# Patient Record
Sex: Female | Born: 1950 | Race: White | Hispanic: No | Marital: Married | Smoking: Never smoker
Health system: Southern US, Community
[De-identification: ages and names within clinical notes are randomized; demographics above are authoritative.]

## PROBLEM LIST (undated history)

## (undated) DIAGNOSIS — T8859XA Other complications of anesthesia, initial encounter: Secondary | ICD-10-CM

## (undated) DIAGNOSIS — C801 Malignant (primary) neoplasm, unspecified: Secondary | ICD-10-CM

## (undated) DIAGNOSIS — I10 Essential (primary) hypertension: Secondary | ICD-10-CM

## (undated) DIAGNOSIS — T4145XA Adverse effect of unspecified anesthetic, initial encounter: Secondary | ICD-10-CM

## (undated) HISTORY — DX: Essential (primary) hypertension: I10

## (undated) HISTORY — PX: BUNIONECTOMY: SHX129

## (undated) HISTORY — DX: Malignant (primary) neoplasm, unspecified: C80.1

---

## 1998-06-15 HISTORY — PX: GANGLION CYST EXCISION: SHX1691

## 2006-02-02 ENCOUNTER — Emergency Department (HOSPITAL_COMMUNITY): Admission: EM | Admit: 2006-02-02 | Discharge: 2006-02-02 | Payer: Self-pay | Admitting: Emergency Medicine

## 2006-12-30 ENCOUNTER — Other Ambulatory Visit: Admission: RE | Admit: 2006-12-30 | Discharge: 2006-12-30 | Payer: Self-pay | Admitting: Internal Medicine

## 2008-01-12 ENCOUNTER — Encounter: Admission: RE | Admit: 2008-01-12 | Discharge: 2008-01-12 | Payer: Self-pay | Admitting: Internal Medicine

## 2008-02-10 ENCOUNTER — Other Ambulatory Visit: Admission: RE | Admit: 2008-02-10 | Discharge: 2008-02-10 | Payer: Self-pay | Admitting: Internal Medicine

## 2009-02-25 ENCOUNTER — Other Ambulatory Visit: Admission: RE | Admit: 2009-02-25 | Discharge: 2009-02-25 | Payer: Self-pay | Admitting: Neurology

## 2012-08-02 ENCOUNTER — Other Ambulatory Visit (HOSPITAL_COMMUNITY)
Admission: RE | Admit: 2012-08-02 | Discharge: 2012-08-02 | Disposition: A | Discharge status: Home / Self Care | Payer: BC Managed Care – PPO | Source: Ambulatory Visit | Attending: Internal Medicine | Admitting: Internal Medicine

## 2012-08-02 DIAGNOSIS — Z01419 Encounter for gynecological examination (general) (routine) without abnormal findings: Secondary | ICD-10-CM | POA: Insufficient documentation

## 2012-08-08 ENCOUNTER — Other Ambulatory Visit: Payer: Self-pay | Admitting: Internal Medicine

## 2012-09-05 ENCOUNTER — Ambulatory Visit
Admission: RE | Admit: 2012-09-05 | Discharge: 2012-09-05 | Disposition: A | Discharge status: Home / Self Care | Payer: BC Managed Care – PPO | Source: Ambulatory Visit | Attending: Internal Medicine | Admitting: Internal Medicine

## 2012-09-05 DIAGNOSIS — Z1231 Encounter for screening mammogram for malignant neoplasm of breast: Secondary | ICD-10-CM

## 2012-11-01 ENCOUNTER — Encounter (INDEPENDENT_AMBULATORY_CARE_PROVIDER_SITE_OTHER): Payer: Self-pay | Admitting: Surgery

## 2012-11-04 ENCOUNTER — Encounter (INDEPENDENT_AMBULATORY_CARE_PROVIDER_SITE_OTHER): Payer: Self-pay

## 2012-11-04 ENCOUNTER — Encounter (INDEPENDENT_AMBULATORY_CARE_PROVIDER_SITE_OTHER): Payer: Self-pay | Admitting: Surgery

## 2012-11-04 ENCOUNTER — Ambulatory Visit (INDEPENDENT_AMBULATORY_CARE_PROVIDER_SITE_OTHER): Payer: BC Managed Care – PPO | Admitting: Surgery

## 2012-11-04 VITALS — BP 96/70 | HR 72 | Temp 99.3°F | Resp 14 | Ht 63.0 in | Wt 118.2 lb

## 2012-11-04 DIAGNOSIS — D126 Benign neoplasm of colon, unspecified: Secondary | ICD-10-CM

## 2012-11-04 NOTE — Progress Notes (Signed)
Patient ID: Judith Green, female   DOB: [birth year], 62 y.o.   MRN: [id number]  Chief Complaint  Patient presents with  . Other    colon polyp    HPI Judith Green is a 62 y.o. female.   HPI This is a very pleasant female referred by Dr. Dulce Sellar after screening colonoscopy demonstrated a large tubular adenoma in the hepatic flexure which was too large to be removed endoscopically. She is asymptomatic. She is fairly healthy and has no complaints. She has no obstructive symptoms. She moved her bowels well. Past Medical History  Diagnosis Date  . Hypertension     Past Surgical History  Procedure Laterality Date  . Bunionectomy      bil  . Ganglion cyst excision  2000    lt foot    Family History  Problem Relation Age of Onset  . Cancer Maternal Grandmother     ovarian    Social History History  Substance Use Topics  . Smoking status: Former Games developer  . Smokeless tobacco: Not on file  . Alcohol Use: No    Allergies  Allergen Reactions  . Codeine   . Keflex (Cephalexin) Nausea And Vomiting  . Losartan   . Penicillins Rash    Current Outpatient Prescriptions  Medication Sig Dispense Refill  . amLODipine (NORVASC) 10 MG tablet Take 10 mg by mouth daily.      Marland Kitchen aspirin 81 MG tablet Take 81 mg by mouth daily.      . cholecalciferol (VITAMIN D) 1000 UNITS tablet Take 1,000 Units by mouth daily.      Marland Kitchen triamterene-hydrochlorothiazide (MAXZIDE-25) 37.5-25 MG per tablet Take 1 tablet by mouth daily.       No current facility-administered medications for this visit.    Review of Systems Review of Systems  Constitutional: Negative for fever, chills and unexpected weight change.  HENT: Negative for hearing loss, congestion, sore throat, trouble swallowing and voice change.   Eyes: Negative for visual disturbance.  Respiratory: Negative for cough and wheezing.   Cardiovascular: Negative for chest pain, palpitations and leg swelling.  Gastrointestinal: Negative for nausea,  vomiting, abdominal pain, diarrhea, constipation, blood in stool, abdominal distention and anal bleeding.  Genitourinary: Negative for hematuria, vaginal bleeding and difficulty urinating.  Musculoskeletal: Negative for arthralgias.  Skin: Negative for rash and wound.  Neurological: Negative for seizures, syncope and headaches.  Hematological: Negative for adenopathy. Does not bruise/bleed easily.  Psychiatric/Behavioral: Negative for confusion.    Blood pressure 96/70, pulse 72, temperature 99.3 F (37.4 C), temperature source Temporal, resp. rate 14, height 5\' 3"  (1.6 m), weight 118 lb 3.2 oz (53.615 kg).  Physical Exam Physical Exam  Constitutional: She is oriented to person, place, and time. She appears well-developed and well-nourished. No distress.  HENT:  Head: Normocephalic and atraumatic.  Right Ear: External ear normal.  Left Ear: External ear normal.  Nose: Nose normal.  Mouth/Throat: Oropharynx is clear and moist.  Eyes: Conjunctivae are normal. Pupils are equal, round, and reactive to light. Right eye exhibits no discharge. Left eye exhibits no discharge. No scleral icterus.  Neck: Normal range of motion. Neck supple. No tracheal deviation present. No thyromegaly present.  Cardiovascular: Normal rate, regular rhythm, normal heart sounds and intact distal pulses.   No murmur heard. Pulmonary/Chest: Effort normal and breath sounds normal. No respiratory distress. She has no wheezes. She has no rales.  Abdominal: Soft. Bowel sounds are normal. She exhibits no distension and no mass. There is no tenderness. There  is no rebound.  Musculoskeletal: Normal range of motion. She exhibits no edema and no tenderness.  Lymphadenopathy:    She has no cervical adenopathy.  Neurological: She is alert and oriented to person, place, and time.  Skin: Skin is warm and dry. No rash noted. She is not diaphoretic. No erythema.  Psychiatric: Her behavior is normal. Judgment normal.    Data  Reviewed I have the notes from Dr. Dulce Sellar.  I have picture from the endoscopy including the mass in the hepatic flexure. This area was tattooed. The pathology confirmed a tubular adenoma without evidence of malignancy  Assessment    Tubular adenoma of the colon     Plan    As this area is too large to remove with the colonoscope, laparoscopic-assisted partial colectomy is recommended. I discussed the reasonings with this with the patient in detail. I gave her literature regarding surgery. I discussed the risks of surgery which includes but is not limited to bleeding, infection, injury to surrounding structures, need for further surgery, anastomotic leak, the potential for finding malignancy, etc. She understands and wishes to proceed. Surgery will be scheduled. Preoperative bowel preparation will be ordered. Likelihood of success is good        Amerigo Mcglory A 11/04/2012, 11:02 AM

## 2012-11-08 ENCOUNTER — Encounter (HOSPITAL_COMMUNITY): Payer: Self-pay | Admitting: Pharmacy Technician

## 2012-11-09 ENCOUNTER — Encounter (HOSPITAL_COMMUNITY): Payer: Self-pay

## 2012-11-09 ENCOUNTER — Telehealth (INDEPENDENT_AMBULATORY_CARE_PROVIDER_SITE_OTHER): Payer: Self-pay | Admitting: General Surgery

## 2012-11-09 ENCOUNTER — Encounter (HOSPITAL_COMMUNITY)
Admission: RE | Admit: 2012-11-09 | Discharge: 2012-11-09 | Disposition: A | Discharge status: Home / Self Care | Payer: BC Managed Care – PPO | Source: Ambulatory Visit | Attending: Surgery | Admitting: Surgery

## 2012-11-09 ENCOUNTER — Ambulatory Visit (HOSPITAL_COMMUNITY)
Admission: RE | Admit: 2012-11-09 | Discharge: 2012-11-09 | Disposition: A | Discharge status: Home / Self Care | Payer: BC Managed Care – PPO | Source: Ambulatory Visit | Attending: Surgery | Admitting: Surgery

## 2012-11-09 DIAGNOSIS — Z01818 Encounter for other preprocedural examination: Secondary | ICD-10-CM | POA: Insufficient documentation

## 2012-11-09 DIAGNOSIS — Z01810 Encounter for preprocedural cardiovascular examination: Secondary | ICD-10-CM | POA: Insufficient documentation

## 2012-11-09 DIAGNOSIS — I10 Essential (primary) hypertension: Secondary | ICD-10-CM | POA: Insufficient documentation

## 2012-11-09 HISTORY — DX: Adverse effect of unspecified anesthetic, initial encounter: T41.45XA

## 2012-11-09 HISTORY — DX: Other complications of anesthesia, initial encounter: T88.59XA

## 2012-11-09 LAB — CBC
HCT: 41.9 % (ref 36.0–46.0)
Hemoglobin: 14.1 g/dL (ref 12.0–15.0)
MCHC: 33.7 g/dL (ref 30.0–36.0)
MCV: 83.3 fL (ref 78.0–100.0)
RDW: 13.5 % (ref 11.5–15.5)

## 2012-11-09 LAB — SURGICAL PCR SCREEN
MRSA, PCR: NEGATIVE
Staphylococcus aureus: POSITIVE — AB

## 2012-11-09 LAB — BASIC METABOLIC PANEL
BUN: 21 mg/dL (ref 6–23)
Chloride: 99 mEq/L (ref 96–112)
Creatinine, Ser: 0.91 mg/dL (ref 0.50–1.10)
GFR calc Af Amer: 77 mL/min — ABNORMAL LOW (ref >90)
GFR calc non Af Amer: 67 mL/min — ABNORMAL LOW (ref >90)
Glucose, Bld: 118 mg/dL — ABNORMAL HIGH (ref 70–99)
Potassium: 3.1 mEq/L — ABNORMAL LOW (ref 3.5–5.1)

## 2012-11-09 NOTE — Telephone Encounter (Signed)
i'm fine with her potassium

## 2012-11-09 NOTE — Patient Instructions (Signed)
Judith Green  11/09/2012   Your procedure is scheduled on:  11/14/12    Report to [facility] at     1000 AM.  Call this number if you have problems the morning of surgery: [phone]   Remember:   Do not eat food or drink liquids after midnight.   Take these medicines the morning of surgery with A SIP OF WATER:    Do not wear jewelry, make-up or nail polish.  Do not wear lotions, powders, or perfumes.   Do not shave 48 hours prior to surgery.   Do not bring valuables to the hospital.  Contacts, dentures or bridgework may not be worn into surgery.  Leave suitcase in the car. After surgery it may be brought to your room.  For patients admitted to the hospital, checkout time is 11:00 AM the day of  discharge.    SEE CHG INSTRUCTION SHEET    Please read over the following fact sheets that you were given: MRSA Information, coughing and deep breathing exercises, leg exercises, Blood Transfusion Fact sheet                Failure to comply with these instructions may result in cancellation of your surgery.                Patient Signature ____________________________              Nurse Signature _____________________________

## 2012-11-09 NOTE — Telephone Encounter (Signed)
Carol with [facility] called to make [place] aware patient's preoperative potassium is at 3.1. She wanted to make sure Dr Magnus Ivan was aware. She is scheduled for a colectomy on 11/14/2012.

## 2012-11-09 NOTE — Progress Notes (Signed)
Sent via EPIC results of BMP done 11/09/12 to DR Barrie Dunker.

## 2012-11-09 NOTE — Progress Notes (Signed)
Spoke with Lesly Rubenstein at office of Dr Barrie Dunker regarding potassium results.She will let Dr Barrie Dunker be aware.

## 2012-11-09 NOTE — Telephone Encounter (Signed)
Tried to make Okey Regal at [facility] aware. They are gone for the day.

## 2012-11-10 ENCOUNTER — Telehealth (INDEPENDENT_AMBULATORY_CARE_PROVIDER_SITE_OTHER): Payer: Self-pay | Admitting: General Surgery

## 2012-11-10 NOTE — Progress Notes (Signed)
Received call from [facility] at [facility] and she stated that Dr Magnus Ivan was fine with results of potassium done on 11/09/12 at preop visit.

## 2012-11-10 NOTE — Telephone Encounter (Signed)
Called Carol @ [facility] and told her that Dr Magnus Ivan is fine with her potassium

## 2012-11-13 MED ORDER — GENTAMICIN SULFATE 40 MG/ML IJ SOLN
5.0000 mg/kg | INTRAMUSCULAR | Status: AC
  Administered 2012-11-14: 268 mg via INTRAVENOUS
  Filled 2012-11-13 (×2): qty 6.7

## 2012-11-13 MED ORDER — CLINDAMYCIN PHOSPHATE 900 MG/50ML IV SOLN
900.0000 mg | INTRAVENOUS | Status: AC
  Administered 2012-11-14: 900 mg via INTRAVENOUS
  Filled 2012-11-13 (×2): qty 50

## 2012-11-13 NOTE — H&P (Signed)
Patient ID: Judith Green, female DOB: 1950/08/07, 62 y.o. MRN: [id number]  Chief Complaint   Patient presents with   .  Other     colon polyp   HPI  Judith Green is a 62 y.o. female.  HPI  This is a very pleasant female referred by Dr. Dulce Sellar after screening colonoscopy demonstrated a large tubular adenoma in the hepatic flexure which was too large to be removed endoscopically. She is asymptomatic. She is fairly healthy and has no complaints. She has no obstructive symptoms. She moved her bowels well.  Past Medical History   Diagnosis  Date   .  Hypertension     Past Surgical History   Procedure  Laterality  Date   .  Bunionectomy       bil   .  Ganglion cyst excision   2000     lt foot    Family History   Problem  Relation  Age of Onset   .  Cancer  Maternal Grandmother      ovarian   Social History  History   Substance Use Topics   .  Smoking status:  Former Games developer   .  Smokeless tobacco:  Not on file   .  Alcohol Use:  No    Allergies   Allergen  Reactions   .  Codeine    .  Keflex (Cephalexin)  Nausea And Vomiting   .  Losartan    .  Penicillins  Rash    Current Outpatient Prescriptions   Medication  Sig  Dispense  Refill   .  amLODipine (NORVASC) 10 MG tablet  Take 10 mg by mouth daily.     Marland Kitchen  aspirin 81 MG tablet  Take 81 mg by mouth daily.     .  cholecalciferol (VITAMIN D) 1000 UNITS tablet  Take 1,000 Units by mouth daily.     Marland Kitchen  triamterene-hydrochlorothiazide (MAXZIDE-25) 37.5-25 MG per tablet  Take 1 tablet by mouth daily.      No current facility-administered medications for this visit.   Review of Systems  Review of Systems  Constitutional: Negative for fever, chills and unexpected weight change.  HENT: Negative for hearing loss, congestion, sore throat, trouble swallowing and voice change.  Eyes: Negative for visual disturbance.  Respiratory: Negative for cough and wheezing.  Cardiovascular: Negative for chest pain, palpitations and leg swelling.   Gastrointestinal: Negative for nausea, vomiting, abdominal pain, diarrhea, constipation, blood in stool, abdominal distention and anal bleeding.  Genitourinary: Negative for hematuria, vaginal bleeding and difficulty urinating.  Musculoskeletal: Negative for arthralgias.  Skin: Negative for rash and wound.  Neurological: Negative for seizures, syncope and headaches.  Hematological: Negative for adenopathy. Does not bruise/bleed easily.  Psychiatric/Behavioral: Negative for confusion.  Blood pressure 96/70, pulse 72, temperature 99.3 F (37.4 C), temperature source Temporal, resp. rate 14, height 5\' 3"  (1.6 m), weight 118 lb 3.2 oz (53.615 kg).  Physical Exam  Physical Exam  Constitutional: She is oriented to person, place, and time. She appears well-developed and well-nourished. No distress.  HENT:  Head: Normocephalic and atraumatic.  Right Ear: External ear normal.  Left Ear: External ear normal.  Nose: Nose normal.  Mouth/Throat: Oropharynx is clear and moist.  Eyes: Conjunctivae are normal. Pupils are equal, round, and reactive to light. Right eye exhibits no discharge. Left eye exhibits no discharge. No scleral icterus.  Neck: Normal range of motion. Neck supple. No tracheal deviation present. No thyromegaly present.  Cardiovascular: Normal rate, regular  rhythm, normal heart sounds and intact distal pulses.  No murmur heard.  Pulmonary/Chest: Effort normal and breath sounds normal. No respiratory distress. She has no wheezes. She has no rales.  Abdominal: Soft. Bowel sounds are normal. She exhibits no distension and no mass. There is no tenderness. There is no rebound.  Musculoskeletal: Normal range of motion. She exhibits no edema and no tenderness.  Lymphadenopathy:  She has no cervical adenopathy.  Neurological: She is alert and oriented to person, place, and time.  Skin: Skin is warm and dry. No rash noted. She is not diaphoretic. No erythema.  Psychiatric: Her behavior is  normal. Judgment normal.  Data Reviewed  I have the notes from Dr. Dulce Sellar. I have picture from the endoscopy including the mass in the hepatic flexure. This area was tattooed. The pathology confirmed a tubular adenoma without evidence of malignancy  Assessment  Tubular adenoma of the colon  Plan  As this area is too large to remove with the colonoscope, laparoscopic-assisted partial colectomy is recommended. I discussed the reasonings with this with the patient in detail. I gave her literature regarding surgery. I discussed the risks of surgery which includes but is not limited to bleeding, infection, injury to surrounding structures, need for further surgery, anastomotic leak, the potential for finding malignancy, etc. She understands and wishes to proceed. Surgery will be scheduled. Preoperative bowel preparation will be ordered. Likelihood of success is good

## 2012-11-14 ENCOUNTER — Encounter (HOSPITAL_COMMUNITY): Payer: Self-pay | Admitting: Anesthesiology

## 2012-11-14 ENCOUNTER — Inpatient Hospital Stay (HOSPITAL_COMMUNITY)
Admission: RE | Admit: 2012-11-14 | Discharge: 2012-11-18 | DRG: 148 | Disposition: A | Discharge status: Home / Self Care | Payer: BC Managed Care – PPO | Source: Ambulatory Visit | Attending: Surgery | Admitting: Surgery

## 2012-11-14 ENCOUNTER — Encounter (HOSPITAL_COMMUNITY): Payer: Self-pay | Admitting: *Deleted

## 2012-11-14 ENCOUNTER — Inpatient Hospital Stay (HOSPITAL_COMMUNITY): Payer: BC Managed Care – PPO | Admitting: Anesthesiology

## 2012-11-14 ENCOUNTER — Encounter (HOSPITAL_COMMUNITY)
Admission: RE | Disposition: A | Discharge status: Home / Self Care | Payer: Self-pay | Source: Ambulatory Visit | Attending: Surgery

## 2012-11-14 DIAGNOSIS — D375 Neoplasm of uncertain behavior of rectum: Secondary | ICD-10-CM

## 2012-11-14 DIAGNOSIS — D378 Neoplasm of uncertain behavior of other specified digestive organs: Secondary | ICD-10-CM

## 2012-11-14 DIAGNOSIS — D126 Benign neoplasm of colon, unspecified: Principal | ICD-10-CM | POA: Diagnosis present

## 2012-11-14 DIAGNOSIS — E876 Hypokalemia: Secondary | ICD-10-CM | POA: Diagnosis not present

## 2012-11-14 DIAGNOSIS — C801 Malignant (primary) neoplasm, unspecified: Secondary | ICD-10-CM

## 2012-11-14 DIAGNOSIS — C8293 Follicular lymphoma, unspecified, intra-abdominal lymph nodes: Secondary | ICD-10-CM | POA: Diagnosis present

## 2012-11-14 DIAGNOSIS — D371 Neoplasm of uncertain behavior of stomach: Secondary | ICD-10-CM

## 2012-11-14 HISTORY — PX: LAPAROSCOPIC PARTIAL COLECTOMY: SHX5907

## 2012-11-14 HISTORY — DX: Malignant (primary) neoplasm, unspecified: C80.1

## 2012-11-14 LAB — TYPE AND SCREEN
ABO/RH(D): A POS
Antibody Screen: NEGATIVE

## 2012-11-14 SURGERY — LAPAROSCOPIC PARTIAL COLECTOMY
Anesthesia: General | Wound class: Clean Contaminated

## 2012-11-14 MED ORDER — ALVIMOPAN 12 MG PO CAPS
12.0000 mg | ORAL_CAPSULE | Freq: Once | ORAL | Status: AC
  Administered 2012-11-14: 12 mg via ORAL
  Filled 2012-11-14: qty 1

## 2012-11-14 MED ORDER — ROCURONIUM BROMIDE 100 MG/10ML IV SOLN
INTRAVENOUS | Status: DC | PRN
  Administered 2012-11-14: 30 mg via INTRAVENOUS

## 2012-11-14 MED ORDER — DIPHENHYDRAMINE HCL 50 MG/ML IJ SOLN: 12.5000 mg | Freq: Four times a day (QID) | INTRAMUSCULAR | Status: DC | PRN

## 2012-11-14 MED ORDER — BUPIVACAINE HCL (PF) 0.5 % IJ SOLN
INTRAMUSCULAR | Status: AC
  Filled 2012-11-14: qty 30

## 2012-11-14 MED ORDER — NALOXONE HCL 0.4 MG/ML IJ SOLN: 0.4000 mg | INTRAMUSCULAR | Status: DC | PRN

## 2012-11-14 MED ORDER — HYDROMORPHONE 0.3 MG/ML IV SOLN
INTRAVENOUS | Status: AC
  Administered 2012-11-14: 0.3 mg
  Filled 2012-11-14: qty 25

## 2012-11-14 MED ORDER — HYDROMORPHONE HCL PF 1 MG/ML IJ SOLN
INTRAMUSCULAR | Status: AC
  Filled 2012-11-14: qty 1

## 2012-11-14 MED ORDER — LACTATED RINGERS IV SOLN
INTRAVENOUS | Status: DC
  Administered 2012-11-14: 1000 mL via INTRAVENOUS

## 2012-11-14 MED ORDER — KETOROLAC TROMETHAMINE 30 MG/ML IJ SOLN
30.0000 mg | Freq: Once | INTRAMUSCULAR | Status: AC | PRN
  Administered 2012-11-14: 30 mg via INTRAVENOUS
  Filled 2012-11-14: qty 1

## 2012-11-14 MED ORDER — POTASSIUM CHLORIDE IN NACL 20-0.9 MEQ/L-% IV SOLN
INTRAVENOUS | Status: DC
  Administered 2012-11-14 – 2012-11-17 (×8): via INTRAVENOUS
  Filled 2012-11-14 (×9): qty 1000

## 2012-11-14 MED ORDER — HYDROMORPHONE 0.3 MG/ML IV SOLN
INTRAVENOUS | Status: DC
  Administered 2012-11-14: 0.5 mg via INTRAVENOUS
  Administered 2012-11-14: 1.5 mg via INTRAVENOUS
  Administered 2012-11-14: 4.8 mg via INTRAVENOUS
  Administered 2012-11-15: 2.4 mg via INTRAVENOUS
  Administered 2012-11-15: 2.7 mg via INTRAVENOUS
  Administered 2012-11-15: 4.32 mg via INTRAVENOUS
  Administered 2012-11-15: 6 mg via INTRAVENOUS
  Administered 2012-11-15 (×2): via INTRAVENOUS
  Administered 2012-11-15: 4.5 mg via INTRAVENOUS
  Administered 2012-11-16: 5.99 mg via INTRAVENOUS
  Administered 2012-11-16: 3.3 mg via INTRAVENOUS
  Administered 2012-11-16: 3.44 mg via INTRAVENOUS
  Filled 2012-11-14 (×4): qty 25

## 2012-11-14 MED ORDER — LACTATED RINGERS IR SOLN
Status: DC | PRN
  Administered 2012-11-14: 1

## 2012-11-14 MED ORDER — MIDAZOLAM HCL 5 MG/5ML IJ SOLN
INTRAMUSCULAR | Status: DC | PRN
  Administered 2012-11-14: 2 mg via INTRAVENOUS

## 2012-11-14 MED ORDER — GLYCOPYRROLATE 0.2 MG/ML IJ SOLN
INTRAMUSCULAR | Status: DC | PRN
  Administered 2012-11-14: 0.6 mg via INTRAVENOUS

## 2012-11-14 MED ORDER — NEOSTIGMINE METHYLSULFATE 1 MG/ML IJ SOLN
INTRAMUSCULAR | Status: DC | PRN
  Administered 2012-11-14: 3 mg via INTRAVENOUS

## 2012-11-14 MED ORDER — LIDOCAINE HCL (CARDIAC) 20 MG/ML IV SOLN
INTRAVENOUS | Status: DC | PRN
  Administered 2012-11-14: 50 mg via INTRAVENOUS

## 2012-11-14 MED ORDER — LACTATED RINGERS IV SOLN: INTRAVENOUS | Status: DC

## 2012-11-14 MED ORDER — ONDANSETRON HCL 4 MG/2ML IJ SOLN
INTRAMUSCULAR | Status: DC | PRN
  Administered 2012-11-14: 4 mg via INTRAVENOUS

## 2012-11-14 MED ORDER — EPHEDRINE SULFATE 50 MG/ML IJ SOLN
INTRAMUSCULAR | Status: DC | PRN
  Administered 2012-11-14 (×2): 5 mg via INTRAVENOUS

## 2012-11-14 MED ORDER — ONDANSETRON HCL 4 MG/2ML IJ SOLN
4.0000 mg | Freq: Four times a day (QID) | INTRAMUSCULAR | Status: DC | PRN
  Administered 2012-11-18: 4 mg via INTRAVENOUS
  Filled 2012-11-14 (×2): qty 2

## 2012-11-14 MED ORDER — 0.9 % SODIUM CHLORIDE (POUR BTL) OPTIME
TOPICAL | Status: DC | PRN
  Administered 2012-11-14: 2000 mL

## 2012-11-14 MED ORDER — ONDANSETRON HCL 4 MG PO TABS: 4.0000 mg | ORAL_TABLET | Freq: Four times a day (QID) | ORAL | Status: DC | PRN

## 2012-11-14 MED ORDER — HYDROMORPHONE HCL PF 1 MG/ML IJ SOLN
0.2500 mg | INTRAMUSCULAR | Status: DC | PRN
  Administered 2012-11-14: 0.5 mg via INTRAVENOUS
  Administered 2012-11-14 (×2): 0.25 mg via INTRAVENOUS

## 2012-11-14 MED ORDER — ENOXAPARIN SODIUM 40 MG/0.4ML ~~LOC~~ SOLN
40.0000 mg | SUBCUTANEOUS | Status: DC
  Administered 2012-11-15 – 2012-11-18 (×4): 40 mg via SUBCUTANEOUS
  Filled 2012-11-14 (×5): qty 0.4

## 2012-11-14 MED ORDER — BUPIVACAINE HCL 0.5 % IJ SOLN
INTRAMUSCULAR | Status: DC | PRN
  Administered 2012-11-14: 10 mL

## 2012-11-14 MED ORDER — TRIAMTERENE-HCTZ 37.5-25 MG PO TABS
1.0000 | ORAL_TABLET | Freq: Every morning | ORAL | Status: DC
  Administered 2012-11-15 – 2012-11-18 (×4): 1 via ORAL
  Filled 2012-11-14 (×5): qty 1

## 2012-11-14 MED ORDER — CLINDAMYCIN PHOSPHATE 900 MG/50ML IV SOLN
INTRAVENOUS | Status: AC
  Filled 2012-11-14: qty 50

## 2012-11-14 MED ORDER — ONDANSETRON HCL 4 MG/2ML IJ SOLN
4.0000 mg | Freq: Four times a day (QID) | INTRAMUSCULAR | Status: DC | PRN
  Administered 2012-11-15 (×2): 4 mg via INTRAVENOUS
  Filled 2012-11-14: qty 2

## 2012-11-14 MED ORDER — BUPIVACAINE-EPINEPHRINE PF 0.25-1:200000 % IJ SOLN
INTRAMUSCULAR | Status: AC
  Filled 2012-11-14: qty 30

## 2012-11-14 MED ORDER — DIPHENHYDRAMINE HCL 12.5 MG/5ML PO ELIX: 12.5000 mg | ORAL_SOLUTION | Freq: Four times a day (QID) | ORAL | Status: DC | PRN

## 2012-11-14 MED ORDER — ALVIMOPAN 12 MG PO CAPS
12.0000 mg | ORAL_CAPSULE | Freq: Two times a day (BID) | ORAL | Status: DC
  Administered 2012-11-15 – 2012-11-18 (×7): 12 mg via ORAL
  Filled 2012-11-14 (×8): qty 1

## 2012-11-14 MED ORDER — SODIUM CHLORIDE 0.9 % IJ SOLN: 9.0000 mL | INTRAMUSCULAR | Status: DC | PRN

## 2012-11-14 MED ORDER — FENTANYL CITRATE 0.05 MG/ML IJ SOLN
INTRAMUSCULAR | Status: DC | PRN
  Administered 2012-11-14 (×2): 50 ug via INTRAVENOUS
  Administered 2012-11-14: 100 ug via INTRAVENOUS

## 2012-11-14 MED ORDER — PROPOFOL 10 MG/ML IV BOLUS
INTRAVENOUS | Status: DC | PRN
  Administered 2012-11-14: 110 mg via INTRAVENOUS

## 2012-11-14 MED ORDER — AMLODIPINE BESYLATE 10 MG PO TABS
10.0000 mg | ORAL_TABLET | Freq: Every morning | ORAL | Status: DC
  Administered 2012-11-15 – 2012-11-18 (×4): 10 mg via ORAL
  Filled 2012-11-14 (×4): qty 1

## 2012-11-14 SURGICAL SUPPLY — 77 items
APPLIER CLIP 5 13 M/L LIGAMAX5 (MISCELLANEOUS)
APPLIER CLIP ROT 10 11.4 M/L (STAPLE)
APR CLP MED LRG 5 ANG JAW (MISCELLANEOUS)
BLADE EXTENDED COATED 6.5IN (ELECTRODE) IMPLANT
BLADE HEX COATED 2.75 (ELECTRODE) ×2 IMPLANT
BLADE SURG SZ10 CARB STEEL (BLADE) ×2 IMPLANT
CABLE HIGH FREQUENCY MONO STRZ (ELECTRODE) IMPLANT
CANISTER SUCTION 2500CC (MISCELLANEOUS) ×2 IMPLANT
CELLS DAT CNTRL 66122 CELL SVR (MISCELLANEOUS) IMPLANT
CHLORAPREP W/TINT 26ML (MISCELLANEOUS) ×2 IMPLANT
CLIP APPLIE 5 13 M/L LIGAMAX5 (MISCELLANEOUS) IMPLANT
CLIP APPLIE ROT 10 11.4 M/L (STAPLE) IMPLANT
CLOTH BEACON ORANGE TIMEOUT ST (SAFETY) ×2 IMPLANT
COVER MAYO STAND STRL (DRAPES) ×4 IMPLANT
DECANTER SPIKE VIAL GLASS SM (MISCELLANEOUS) IMPLANT
DRAIN CHANNEL 19F RND (DRAIN) IMPLANT
DRAPE LAPAROSCOPIC ABDOMINAL (DRAPES) ×2 IMPLANT
DRAPE LG THREE QUARTER DISP (DRAPES) ×2 IMPLANT
DRAPE UTILITY XL STRL (DRAPES) ×2 IMPLANT
DRAPE WARM FLUID 44X44 (DRAPE) ×2 IMPLANT
DRSG AQUACEL AG ADV 3.5X 4 (GAUZE/BANDAGES/DRESSINGS) IMPLANT
DRSG AQUACEL AG ADV 3.5X 6 (GAUZE/BANDAGES/DRESSINGS) IMPLANT
DRSG OPSITE POSTOP 4X8 (GAUZE/BANDAGES/DRESSINGS) ×2 IMPLANT
ELECT REM PT RETURN 9FT ADLT (ELECTROSURGICAL) ×2
ELECTRODE REM PT RTRN 9FT ADLT (ELECTROSURGICAL) ×1 IMPLANT
EVACUATOR DRAINAGE 10X20 100CC (DRAIN) IMPLANT
EVACUATOR SILICONE 100CC (DRAIN)
GLOVE BIOGEL PI IND STRL 7.0 (GLOVE) ×1 IMPLANT
GLOVE BIOGEL PI INDICATOR 7.0 (GLOVE) ×1
GLOVE SURG SIGNA 7.5 PF LTX (GLOVE) ×6 IMPLANT
GOWN STRL NON-REIN LRG LVL3 (GOWN DISPOSABLE) IMPLANT
GOWN STRL REIN XL XLG (GOWN DISPOSABLE) ×12 IMPLANT
HAND ACTIVATED (MISCELLANEOUS) ×2 IMPLANT
KIT BASIN OR (CUSTOM PROCEDURE TRAY) ×6 IMPLANT
LEGGING LITHOTOMY PAIR STRL (DRAPES) IMPLANT
LIGASURE IMPACT 36 18CM CVD LR (INSTRUMENTS) ×2 IMPLANT
NS IRRIG 1000ML POUR BTL (IV SOLUTION) ×4 IMPLANT
PENCIL BUTTON HOLSTER BLD 10FT (ELECTRODE) ×4 IMPLANT
REGULATOR SUCTION ADULT (MISCELLANEOUS) IMPLANT
RELOAD PROXIMATE 75MM BLUE (ENDOMECHANICALS) ×2 IMPLANT
RTRCTR WOUND ALEXIS 18CM MED (MISCELLANEOUS)
SCISSORS LAP 5X35 DISP (ENDOMECHANICALS) ×2 IMPLANT
SEALER TISSUE G2 CVD JAW 45CM (ENDOMECHANICALS) IMPLANT
SEALER TISSUE X1 CVD JAW (INSTRUMENTS) IMPLANT
SET IRRIG TUBING LAPAROSCOPIC (IRRIGATION / IRRIGATOR) ×2 IMPLANT
SOLUTION ANTI FOG 6CC (MISCELLANEOUS) ×2 IMPLANT
SPONGE GAUZE 4X4 12PLY (GAUZE/BANDAGES/DRESSINGS) IMPLANT
SPONGE LAP 18X18 X RAY DECT (DISPOSABLE) ×4 IMPLANT
STAPLER GUN LINEAR PROX 60 (STAPLE) ×2 IMPLANT
STAPLER PROXIMATE 75MM BLUE (STAPLE) ×2 IMPLANT
STAPLER VISISTAT 35W (STAPLE) ×2 IMPLANT
STRIP CLOSURE SKIN 1/2X4 (GAUZE/BANDAGES/DRESSINGS) ×2 IMPLANT
SUCTION POOLE TIP (SUCTIONS) ×2 IMPLANT
SUT MNCRL AB 4-0 PS2 18 (SUTURE) ×2 IMPLANT
SUT NYLON 3 0 (SUTURE) IMPLANT
SUT PDS AB 1 CTX 36 (SUTURE) IMPLANT
SUT PDS AB 1 TP1 96 (SUTURE) IMPLANT
SUT PROLENE 2 0 KS (SUTURE) IMPLANT
SUT SILK 2 0 (SUTURE) ×1
SUT SILK 2 0 SH CR/8 (SUTURE) ×2 IMPLANT
SUT SILK 2-0 18XBRD TIE 12 (SUTURE) ×1 IMPLANT
SUT SILK 3 0 (SUTURE) ×1
SUT SILK 3 0 SH CR/8 (SUTURE) ×2 IMPLANT
SUT SILK 3-0 18XBRD TIE 12 (SUTURE) ×1 IMPLANT
SUT VIC AB 2-0 SH 18 (SUTURE) ×4 IMPLANT
SUT VICRYL 2 0 18  UND BR (SUTURE) ×2
SUT VICRYL 2 0 18 UND BR (SUTURE) ×2 IMPLANT
SYR 30ML LL (SYRINGE) IMPLANT
TOWEL OR 17X26 10 PK STRL BLUE (TOWEL DISPOSABLE) ×4 IMPLANT
TOWEL OR NON WOVEN STRL DISP B (DISPOSABLE) ×2 IMPLANT
TRAY FOLEY CATH 14FRSI W/METER (CATHETERS) ×2 IMPLANT
TRAY LAP CHOLE (CUSTOM PROCEDURE TRAY) ×2 IMPLANT
TROCAR XCEL BLUNT TIP 100MML (ENDOMECHANICALS) ×2 IMPLANT
TROCAR XCEL NON-BLD 5MMX100MML (ENDOMECHANICALS) ×4 IMPLANT
TUBING FILTER THERMOFLATOR (ELECTROSURGICAL) ×2 IMPLANT
YANKAUER SUCT BULB TIP 10FT TU (MISCELLANEOUS) ×2 IMPLANT
YANKAUER SUCT BULB TIP NO VENT (SUCTIONS) ×4 IMPLANT

## 2012-11-14 NOTE — Anesthesia Postprocedure Evaluation (Signed)
  Anesthesia Post-op Note  Patient: Judith Green  Procedure(s) Performed: Procedure(s) (LRB): LAPAROSCOPIC ASSISTED  PARTIAL COLECTOMY (N/A)  Patient Location: PACU  Anesthesia Type: General  Level of Consciousness: awake and alert   Airway and Oxygen Therapy: Patient Spontanous Breathing  Post-op Pain: mild  Post-op Assessment: Post-op Vital signs reviewed, Patient's Cardiovascular Status Stable, Respiratory Function Stable, Patent Airway and No signs of Nausea or vomiting  Last Vitals:  Filed Vitals:   11/14/12 1509  BP:   Pulse:   Temp:   Resp: 16    Post-op Vital Signs: stable   Complications: No apparent anesthesia complications

## 2012-11-14 NOTE — Anesthesia Preprocedure Evaluation (Addendum)
Anesthesia Evaluation  Patient identified by MRN, date of birth, ID band Patient awake    Reviewed: Allergy & Precautions, H&P , NPO status , Patient's Chart, lab work & pertinent test results  Airway Mallampati: II TM Distance: >3 FB Neck ROM: full    Dental no notable dental hx. (+) Teeth Intact and Dental Advisory Given   Pulmonary neg pulmonary ROS,  breath sounds clear to auscultation  Pulmonary exam normal       Cardiovascular Exercise Tolerance: Good hypertension, Pt. on medications Rhythm:regular Rate:Normal     Neuro/Psych negative neurological ROS  negative psych ROS   GI/Hepatic negative GI ROS, Neg liver ROS,   Endo/Other  negative endocrine ROS  Renal/GU negative Renal ROS  negative genitourinary   Musculoskeletal   Abdominal   Peds  Hematology negative hematology ROS (+)   Anesthesia Other Findings   Reproductive/Obstetrics negative OB ROS                          Anesthesia Physical Anesthesia Plan  ASA: II  Anesthesia Plan: General   Post-op Pain Management:    Induction: Intravenous  Airway Management Planned: Oral ETT  Additional Equipment:   Intra-op Plan:   Post-operative Plan: Extubation in OR  Informed Consent: I have reviewed the patients History and Physical, chart, labs and discussed the procedure including the risks, benefits and alternatives for the proposed anesthesia with the patient or authorized representative who has indicated his/her understanding and acceptance.   Dental Advisory Given  Plan Discussed with: CRNA and Surgeon  Anesthesia Plan Comments:         Anesthesia Quick Evaluation

## 2012-11-14 NOTE — Transfer of Care (Signed)
Immediate Anesthesia Transfer of Care Note  Patient: Judith Green  Procedure(s) Performed: Procedure(s) (LRB): LAPAROSCOPIC ASSISTED  PARTIAL COLECTOMY (N/A)  Patient Location: PACU  Anesthesia Type: General  Level of Consciousness: sedated, patient cooperative and responds to stimulaton  Airway & Oxygen Therapy: Patient Spontanous Breathing and Patient connected to face mask oxgen  Post-op Assessment: Report given to PACU RN and Post -op Vital signs reviewed and stable  Post vital signs: Reviewed and stable  Complications: No apparent anesthesia complications

## 2012-11-14 NOTE — Op Note (Signed)
LAPAROSCOPIC ASSISTED  PARTIAL COLECTOMY  Procedure Note  Judith Green 11/14/2012   Pre-op Diagnosis: colon polyp     Post-op Diagnosis: same  Procedure(s): LAPAROSCOPIC ASSISTED  PARTIAL COLECTOMY  Surgeon(s): Shelly Rubenstein, MD  Anesthesia: General  Staff:  Circulator: Therese Sarah, RN Relief Circulator: Cephus Shelling, RN Scrub Person: Clarnce Flock, CST; Misty Clifton Custard, [place]  Estimated Blood Loss: Minimal               Specimens: sent to path          [facility] A   Date: 11/14/2012  Time: 1:35 PM

## 2012-11-14 NOTE — Interval H&P Note (Signed)
History and Physical Interval Note: no change in H and P  11/14/2012 11:43 AM  Judith Green  has presented today for surgery, with the diagnosis of colon polyp  The various methods of treatment have been discussed with the patient and family. After consideration of risks, benefits and other options for treatment, the patient has consented to  Procedure(s): LAPAROSCOPIC ASSISTED  PARTIAL COLECTOMY (N/A) as a surgical intervention .  The patient's history has been reviewed, patient examined, no change in status, stable for surgery.  I have reviewed the patient's chart and labs.  Questions were answered to the patient's satisfaction.     Treven Holtman A

## 2012-11-15 ENCOUNTER — Encounter (HOSPITAL_COMMUNITY): Payer: Self-pay | Admitting: Surgery

## 2012-11-15 LAB — BASIC METABOLIC PANEL
GFR calc Af Amer: >90 mL/min (ref >90)
GFR calc non Af Amer: >90 mL/min (ref >90)
Glucose, Bld: 138 mg/dL — ABNORMAL HIGH (ref 70–99)
Potassium: 3 mEq/L — ABNORMAL LOW (ref 3.5–5.1)
Sodium: 141 mEq/L (ref 135–145)

## 2012-11-15 LAB — CBC
Hemoglobin: 13.3 g/dL (ref 12.0–15.0)
Platelets: 218 10*3/uL (ref 150–400)
RBC: 4.67 MIL/uL (ref 3.87–5.11)
WBC: 19.5 10*3/uL — ABNORMAL HIGH (ref 4.0–10.5)

## 2012-11-15 MED ORDER — POTASSIUM CHLORIDE 10 MEQ/100ML IV SOLN
10.0000 meq | INTRAVENOUS | Status: AC
  Administered 2012-11-15 (×3): 10 meq via INTRAVENOUS
  Filled 2012-11-15 (×3): qty 100

## 2012-11-15 NOTE — Op Note (Signed)
NAMELAYNEE, LOCKAMY                 ACCOUNT NO.:  [id number]  MEDICAL RECORD NO.:  [id number]  LOCATION:  1524                         FACILITY:  [facility]  PHYSICIAN:  Abigail Miyamoto, M.D. DATE OF BIRTH:  1950-07-26  DATE OF PROCEDURE:  11/14/2012 DATE OF DISCHARGE:                              OPERATIVE REPORT   PREOPERATIVE DIAGNOSIS:  Adenomatous polyp of the colon.  POSTOPERATIVE DIAGNOSIS:  Adenomatous polyp of the colon.  PROCEDURE:  Laparoscopic-assisted partial colectomy.  SURGEON:  Abigail Miyamoto, M.D.  ASSISTANT:  Ollen Gross. Vernell Morgans, M.D.  ANESTHESIA:  General endotracheal anesthesia.  ESTIMATED BLOOD LOSS:  Minimal.  INDICATIONS:  This is a 62 year old female who was found on screening colonoscopy to have a large 4-cm polyp at the hepatic flexure.  Biopsies revealed tubulovillous adenoma.  It could not be removed completely endoscopically.  Therefore, decision was made to proceed with the laparoscopic partial colectomy.  FINDINGS:  The patient's large polyp was easily identified with the specimen tattooed via the endoscopy.  A laparoscopic-assisted right partial colectomy was performed.  PROCEDURE IN DETAIL:  The patient was brought to the operating room, identified as Judith Green.  She was placed supine on the operating room table and general anesthesia was induced.  Her abdomen was then prepped and draped in usual sterile fashion.  I made a small incision above the umbilicus with scalpel and took down the fascia, which was opened with scalpel.  A hemostat was used to pass the peritoneal cavity under direct vision.  Next, 0 Vicryl pursestring suture was placed around the fascial opening.  The Hasson port was placed through the opening and insufflation of the abdomen was begun.  I placed another 5-mm port in the patient's upper midline and one below the umbilicus under direct vision as well.  Initial inspection of the abdomen was unremarkable with no obvious  pathology.  I then identified the cecum, appendix, terminal ileum and right colon.  I mobilized the right colon and cecum along the white line of Toldt.  I could then easily identify the area of tattoo, this was actually proximal to the hepatic flexure.  I took down the hepatic flexure with the Harmonic scalpel as well, easily mobilized past the midline.  I then removed all laparoscopic ports and converted to an open procedure.  I made the incision above the umbilicus slightly larger and then placed a wound protecting device.  I was then able to easily pull the cecum and right colon up through the incision.  I transected the distal ileum with a GIA 75 stapler.  I then transected the colon distal to the obvious mass with the GIA 75 stapler as well.  I then took down the mesentery with the LigaSure, cautery device as well as silk ties.  The appendix itself was slightly thickened and there was an enlarged lymph nodes in the mesentery just at the base of the appendix and appeared to have wide margins around the easily mobile polyp in the colon.  Once this specimen was completely removed, it was sent to Pathology for evaluation.  I then reapproximated the small bowel to the colon in a side-to-side fashion with interrupted  silk sutures.  I made an enterotomy and colotomy with the cautery and then performed a side-to- side anastomosis with a single firing of the GIA 75 stapler.  The open end was then closed with a TA 60 stapler.  A wide anastomosis appeared to be achieved.  I then reinforced it slightly with silk sutures.  I closed the mesentery or defect with silk sutures as well.  I then placed the anastomosis back into the abdominal cavity.  I then irrigated the abdomen with several liters of normal saline.  Hemostasis appeared to be achieved.  The wound protecting device was then removed and we changed our gowns and gloves.  New pouch was then placed around the wound.  The patient's midline  fascia was then closed with running #1 looped PDS suture.  I then irrigated the wound and then anesthetized it with Marcaine.  I then closed all incisions with 4-0 Monocryl sutures.  Steri- Strips and occlusive dressing were then applied.  The patient tolerated the procedure well.  All counts were correct at the end of the procedure.  The patient was then extubated in the operating room and taken in stable condition to the recovery room.     Abigail Miyamoto, M.D.     DB/MEDQ  D:  11/14/2012  T:  11/15/2012  Job:  [id number]

## 2012-11-15 NOTE — Progress Notes (Signed)
1 Day Post-Op  Subjective: No complaints  Objective: Vital signs in last 24 hours: Temp:  [96.8 F (36 C)-98.1 F (36.7 C)] 98.1 F (36.7 C) (06/03 0622) Pulse Rate:  [58-81] 76 (06/03 0622) Resp:  [13-23] 23 (06/03 0750) BP: (97-123)/(45-76) 105/60 mmHg (06/03 0622) SpO2:  [96 %-100 %] 99 % (06/03 0750) Weight:  [119 lb 6.4 oz (54.159 kg)] 119 lb 6.4 oz (54.159 kg) (06/02 1519) Last BM Date: 11/14/12  Intake/Output from previous day: 06/02 0701 - 06/03 0700 In: 2241.7 [I.V.:2241.7] Out: 1575 [Urine:1575] Intake/Output this shift:    Abdomen soft, dressing dry  Lab Results:   Recent Labs  11/15/12 0410  WBC 19.5*  HGB 13.3  HCT 39.1  PLT 218   BMET  Recent Labs  11/15/12 0410  NA 141  K 3.0*  CL 106  CO2 27  GLUCOSE 138*  BUN 9  CREATININE 0.62  CALCIUM 8.5   PT/INR No results found for this basename: LABPROT, INR,  in the last 72 hours ABG No results found for this basename: PHART, PCO2, PO2, HCO3,  in the last 72 hours  Studies/Results: No results found.  Anti-infectives: Anti-infectives   Start     Dose/Rate Route Frequency Ordered Stop   11/14/12 0600  clindamycin (CLEOCIN) IVPB 900 mg     900 mg 100 mL/hr over 30 Minutes Intravenous On call to O.R. 11/13/12 1630 11/14/12 1230   11/14/12 0600  gentamicin (GARAMYCIN) 268 mg in dextrose 5 % 100 mL IVPB     5 mg/kg  53.6 kg 106.7 mL/hr over 60 Minutes Intravenous On call to O.R. 11/13/12 1630 11/14/12 1305      Assessment/Plan: s/p Procedure(s): LAPAROSCOPIC ASSISTED  PARTIAL COLECTOMY (N/A) Hypokalemia - will replace K+ Decrease IVF Clear liquids  LOS: 1 day    Estel Scholze A 11/15/2012

## 2012-11-15 NOTE — Care Management Note (Signed)
    Page 1 of 1   11/15/2012     11:14:15 AM   CARE MANAGEMENT NOTE 11/15/2012  Patient:  Judith Green, Judith Green   Account Number:  [id number]  Date Initiated:  11/15/2012  Documentation initiated by:  Lorenda Ishihara  Subjective/Objective Assessment:   62 yo female admitted s/p lap colectomy. PTA lived at home with spouse.     Action/Plan:   Home when stable   Anticipated DC Date:  11/18/2012   Anticipated DC Plan:  HOME/SELF CARE      DC Planning Services  CM consult      Choice offered to / List presented to:             Status of service:  Completed, signed off Medicare Important Message given?   (If response is "NO", the following Medicare IM given date fields will be blank) Date Medicare IM given:   Date Additional Medicare IM given:    Discharge Disposition:  HOME/SELF CARE  Per UR Regulation:  Reviewed for med. necessity/level of care/duration of stay  If discussed at Long Length of Stay Meetings, dates discussed:    Comments:

## 2012-11-16 MED ORDER — HYDROMORPHONE HCL PF 1 MG/ML IJ SOLN
1.0000 mg | INTRAMUSCULAR | Status: DC | PRN
  Administered 2012-11-17: 1 mg via INTRAVENOUS
  Filled 2012-11-16: qty 1

## 2012-11-16 MED ORDER — HYDROCODONE-ACETAMINOPHEN 5-325 MG PO TABS
1.0000 | ORAL_TABLET | ORAL | Status: DC | PRN
  Administered 2012-11-16 – 2012-11-17 (×5): 2 via ORAL
  Filled 2012-11-16 (×5): qty 2

## 2012-11-16 NOTE — Progress Notes (Signed)
2 Days Post-Op  Subjective: Has a little nausea when standing Passing flatus  Objective: Vital signs in last 24 hours: Temp:  [98.5 F (36.9 C)-99.5 F (37.5 C)] 99.3 F (37.4 C) (06/04 0545) Pulse Rate:  [67-88] 76 (06/04 0545) Resp:  [11-22] 17 (06/04 0545) BP: (99-110)/(50-71) 110/71 mmHg (06/04 0545) SpO2:  [96 %-98 %] 97 % (06/04 0545) FiO2 (%):  [36 %-40 %] 36 % (06/04 0545) Last BM Date: 11/15/12  Intake/Output from previous day: 06/03 0701 - 06/04 0700 In: 3209.2 [P.O.:740; I.V.:2469.2] Out: 1550 [Urine:1550] Intake/Output this shift:    Abdomen soft with good BS Lungs clear  Lab Results:   Recent Labs  11/15/12 0410  WBC 19.5*  HGB 13.3  HCT 39.1  PLT 218   BMET  Recent Labs  11/15/12 0410  NA 141  K 3.0*  CL 106  CO2 27  GLUCOSE 138*  BUN 9  CREATININE 0.62  CALCIUM 8.5   PT/INR No results found for this basename: LABPROT, INR,  in the last 72 hours ABG No results found for this basename: PHART, PCO2, PO2, HCO3,  in the last 72 hours  Studies/Results: No results found.  Anti-infectives: Anti-infectives   Start     Dose/Rate Route Frequency Ordered Stop   11/14/12 0600  clindamycin (CLEOCIN) IVPB 900 mg     900 mg 100 mL/hr over 30 Minutes Intravenous On call to O.R. 11/13/12 1630 11/14/12 1230   11/14/12 0600  gentamicin (GARAMYCIN) 268 mg in dextrose 5 % 100 mL IVPB     5 mg/kg  53.6 kg 106.7 mL/hr over 60 Minutes Intravenous On call to O.R. 11/13/12 1630 11/14/12 1305      Assessment/Plan: s/p Procedure(s): LAPAROSCOPIC ASSISTED  PARTIAL COLECTOMY (N/A)  D/c PCA Full liquids  LOS: 2 days    Sirenity Shew A 11/16/2012

## 2012-11-17 NOTE — Progress Notes (Signed)
3 Days Post-Op  Subjective: Still with mild pain Passing flatus, no BM yesterday  Objective: Vital signs in last 24 hours: Temp:  [98 F (36.7 C)-98.3 F (36.8 C)] 98 F (36.7 C) (06/05 0536) Pulse Rate:  [75-84] 75 (06/05 0536) Resp:  [18-20] 18 (06/05 0536) BP: (99-102)/(61-70) 99/62 mmHg (06/05 0536) SpO2:  [92 %-100 %] 92 % (06/05 0536) Last BM Date: 11/15/12  Intake/Output from previous day: 06/04 0701 - 06/05 0700 In: 1884.2 [P.O.:600; I.V.:1284.2] Out: 2500 [Urine:2500] Intake/Output this shift: Total I/O In: 817.5 [P.O.:240; I.V.:577.5] Out: 1700 [Urine:1700]  Abdomen soft, mildly full  Lab Results:   Recent Labs  11/15/12 0410  WBC 19.5*  HGB 13.3  HCT 39.1  PLT 218   BMET  Recent Labs  11/15/12 0410  NA 141  K 3.0*  CL 106  CO2 27  GLUCOSE 138*  BUN 9  CREATININE 0.62  CALCIUM 8.5   PT/INR No results found for this basename: LABPROT, INR,  in the last 72 hours ABG No results found for this basename: PHART, PCO2, PO2, HCO3,  in the last 72 hours  Studies/Results: No results found.  Anti-infectives: Anti-infectives   Start     Dose/Rate Route Frequency Ordered Stop   11/14/12 0600  clindamycin (CLEOCIN) IVPB 900 mg     900 mg 100 mL/hr over 30 Minutes Intravenous On call to O.R. 11/13/12 1630 11/14/12 1230   11/14/12 0600  gentamicin (GARAMYCIN) 268 mg in dextrose 5 % 100 mL IVPB     5 mg/kg  53.6 kg 106.7 mL/hr over 60 Minutes Intravenous On call to O.R. 11/13/12 1630 11/14/12 1305      Assessment/Plan: s/p Procedure(s): LAPAROSCOPIC ASSISTED  PARTIAL COLECTOMY (N/A)  Continue full liquids Awaiting path ambulate  LOS: 3 days    Deneene Tarver A 11/17/2012

## 2012-11-17 NOTE — Progress Notes (Signed)
Discontinued completed orders Chisum Habenicht RN

## 2012-11-18 MED ORDER — HYDROCODONE-ACETAMINOPHEN 5-325 MG PO TABS: 1.0000 | ORAL_TABLET | ORAL | Status: DC | PRN

## 2012-11-18 NOTE — Discharge Summary (Signed)
Physician Discharge Summary  Patient ID: Judith Green MRN: [id number] DOB/AGE: 1951-04-20 62 y.o.  Admit date: 11/14/2012 Discharge date: 11/18/2012  Admission Diagnoses:  Discharge Diagnoses:  Active Problems:   * No active hospital problems. 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  Diet quickly advanced.  Had BM's POD#4.  Pain well controlled at discharge  Consults: None  Significant Diagnostic Studies:   Treatments: surgery: lap assisted partial colectomy  Discharge Exam: Blood pressure 112/74, pulse 93, temperature 99.1 F (37.3 C), temperature source Oral, resp. rate 18, height 5\' 3"  (1.6 m), weight 119 lb 6.4 oz (54.159 kg), SpO2 98.00%. General appearance: alert, cooperative and no distress Incision/Wound:abdomen soft, incision healing well  Disposition: Final discharge disposition not confirmed   Future Appointments Provider Department Dept Phone   12/05/2012 2:40 PM Shelly Rubenstein, MD [facility], [place] [phone]       Medication List    TAKE these medications       amLODipine 10 MG tablet  Commonly known as:  NORVASC  Take 10 mg by mouth every morning.     aspirin 81 MG tablet  Take 81 mg by mouth every evening.     cholecalciferol 1000 UNITS tablet  Commonly known as:  VITAMIN D  Take 1,000 Units by mouth every evening.     HYDROcodone-acetaminophen 5-325 MG per tablet  Commonly known as:  NORCO/VICODIN  Take 1-2 tablets by mouth every 4 (four) hours as needed.     triamterene-hydrochlorothiazide 37.5-25 MG per tablet  Commonly known as:  MAXZIDE-25  Take 1 tablet by mouth every morning.           Follow-up Information   Follow up with [facility] A, MD. Call in 3 weeks.   Contact information:   [address] [place] [address] [phone]       Signed: Shelly Rubenstein 11/18/2012, 5:34 PM

## 2012-11-18 NOTE — Progress Notes (Signed)
Patient ID: Judith Green, female   DOB: Dec 15, 1950, 62 y.o.   MRN: [id number]  Had multiple BM's.  Feeling well Wants to go home  Will d/c home

## 2012-11-18 NOTE — Progress Notes (Signed)
Patient discharged via wheelchair. Rx for vicodin given. Dr. Magnus Ivan aware patient having multiple bloody BM today. Patient states understanding of discharge instrucitons.

## 2012-11-18 NOTE — Progress Notes (Signed)
4 Days Post-Op  Subjective: Passing flatus but no BM yet Fills bloated  Objective: Vital signs in last 24 hours: Temp:  [98 F (36.7 C)-99.2 F (37.3 C)] 98 F (36.7 C) (06/06 0558) Pulse Rate:  [73-89] 85 (06/06 0558) Resp:  [16-20] 18 (06/06 0558) BP: (95-149)/(60-81) 130/81 mmHg (06/06 0558) SpO2:  [95 %-98 %] 98 % (06/06 0558) Last BM Date: 11/15/12  Intake/Output from previous day: 06/05 0701 - 06/06 0700 In: 1575.8 [P.O.:360; I.V.:1215.8] Out: 4000 [Urine:4000] Intake/Output this shift:    Incision clean, ecchymosis Abdomen mildly full, soft  Lab Results:  No results found for this basename: WBC, HGB, HCT, PLT,  in the last 72 hours BMET No results found for this basename: NA, K, CL, CO2, GLUCOSE, BUN, CREATININE, CALCIUM,  in the last 72 hours PT/INR No results found for this basename: LABPROT, INR,  in the last 72 hours ABG No results found for this basename: PHART, PCO2, PO2, HCO3,  in the last 72 hours  Studies/Results: No results found.  Anti-infectives: Anti-infectives   Start     Dose/Rate Route Frequency Ordered Stop   11/14/12 0600  clindamycin (CLEOCIN) IVPB 900 mg     900 mg 100 mL/hr over 30 Minutes Intravenous On call to O.R. 11/13/12 1630 11/14/12 1230   11/14/12 0600  gentamicin (GARAMYCIN) 268 mg in dextrose 5 % 100 mL IVPB     5 mg/kg  53.6 kg 106.7 mL/hr over 60 Minutes Intravenous On call to O.R. 11/13/12 1630 11/14/12 1305      Assessment/Plan: s/p Procedure(s): LAPAROSCOPIC ASSISTED  PARTIAL COLECTOMY (N/A)  Keep on full liquids ambulate  LOS: 4 days    Judith Green A 11/18/2012

## 2012-11-25 ENCOUNTER — Encounter (INDEPENDENT_AMBULATORY_CARE_PROVIDER_SITE_OTHER): Payer: Self-pay | Admitting: General Surgery

## 2012-11-25 ENCOUNTER — Ambulatory Visit (INDEPENDENT_AMBULATORY_CARE_PROVIDER_SITE_OTHER): Payer: BC Managed Care – PPO | Admitting: General Surgery

## 2012-11-25 VITALS — BP 110/80 | HR 82 | Temp 98.4°F | Resp 14 | Ht 63.0 in | Wt 111.2 lb

## 2012-11-25 DIAGNOSIS — Z09 Encounter for follow-up examination after completed treatment for conditions other than malignant neoplasm: Secondary | ICD-10-CM

## 2012-11-25 MED ORDER — CIPROFLOXACIN HCL 500 MG PO TABS: 500.0000 mg | ORAL_TABLET | Freq: Two times a day (BID) | ORAL | Status: AC

## 2012-11-25 NOTE — Progress Notes (Signed)
History: Patient is 11 days post laparoscopic right colectomy for a polyp at the hepatic flexure. For 2 or 3 days she has noticed a small area of redness and swelling and tenderness in the middle of her small midline incision. She generally is feeling well in terms of no fever or pain his bowels are moving well in appetite is good. She does feel fatigued.  Exam: BP 110/80  Pulse 82  Temp(Src) 98.4 F (36.9 C) (Temporal)  Resp 14  Ht 5\' 3"  (1.6 m)  Wt 111 lb 3.2 oz (50.44 kg)  BMI 19.7 kg/m2 General: Does not appear ill Abdomen: In the middle of her midline incision is a very small less than 1 cm area of mild swelling tenderness and slight erythema. No fluctuance or drainage. Abdomen generally soft and nontender  Assessment and plan: Possible early wound cellulitis versus suture reaction. I'm not sure this represents infection but we will go ahead and treat her with 5 days of Cipro. She understands to call immediately for any worsening redness or swelling or other concerns. Otherwise keep her regular appointment with Dr. Wynn Banker.

## 2012-11-30 ENCOUNTER — Encounter (INDEPENDENT_AMBULATORY_CARE_PROVIDER_SITE_OTHER): Payer: Self-pay

## 2012-12-05 ENCOUNTER — Encounter (INDEPENDENT_AMBULATORY_CARE_PROVIDER_SITE_OTHER): Payer: Self-pay | Admitting: Surgery

## 2012-12-05 ENCOUNTER — Ambulatory Visit (INDEPENDENT_AMBULATORY_CARE_PROVIDER_SITE_OTHER): Payer: BC Managed Care – PPO | Admitting: Surgery

## 2012-12-05 ENCOUNTER — Other Ambulatory Visit (INDEPENDENT_AMBULATORY_CARE_PROVIDER_SITE_OTHER): Payer: Self-pay | Admitting: Surgery

## 2012-12-05 VITALS — BP 124/74 | HR 90 | Temp 99.0°F | Resp 18 | Ht 63.0 in | Wt 111.0 lb

## 2012-12-05 DIAGNOSIS — Z09 Encounter for follow-up examination after completed treatment for conditions other than malignant neoplasm: Secondary | ICD-10-CM

## 2012-12-05 DIAGNOSIS — C8599 Non-Hodgkin lymphoma, unspecified, extranodal and solid organ sites: Secondary | ICD-10-CM

## 2012-12-05 NOTE — Progress Notes (Signed)
Subjective:     Patient ID: Judith Green, female   DOB: August 12, 1950, 62 y.o.   MRN: [id number]  HPI She is here for her first postop visit status post laparoscopic-assisted partial colectomy for a tubulovillous adenoma. Incidentally at the time of surgery she was found to have a lymphoma in her appendix. She has been asymptomatic. Other than mild weakness, she is doing well. She is eating well and moving about well  Review of Systems     Objective:   Physical Exam On exam, her incision is well-healed    Assessment:     Patient stable postop     Plan:     She will refrain from heavy lifting for 3 more weeks. She may return to normal activity on July 14. We will refer her to the cancer center for further recommendations regarding her lymphoma found her appendix

## 2012-12-06 ENCOUNTER — Telehealth: Payer: Self-pay | Admitting: Oncology

## 2012-12-06 NOTE — Telephone Encounter (Signed)
C/D 12/06/12 for appt. 12/24/12

## 2012-12-09 ENCOUNTER — Telehealth: Payer: Self-pay | Admitting: Medical Oncology

## 2012-12-09 NOTE — Telephone Encounter (Signed)
Patient called asking why she needs appt with Dr Welton Flakes sched 07/01 if she is not sched to have a PET scan. This would be her first time visit with MD. Informed patient will review with MD and call her back before end of the day.

## 2012-12-09 NOTE — Telephone Encounter (Signed)
F/U to pt's call from earlier today and informed patient Per MD, that since this is first time appt a Med Onc MD, Dr Welton Flakes would like to meet pt and discuss with pt any tests that may need to be ordered and discuss appropriate plans. Patient expressed understanding, confirmed appts as scheduled. No further questions at this time.

## 2012-12-09 NOTE — Telephone Encounter (Signed)
Please inform patient that this is a new office visit with an medical oncologist. In order for me to order any kind of tests we need to discuss them and plan the appropriate plans

## 2012-12-13 ENCOUNTER — Encounter: Payer: Self-pay | Admitting: Oncology

## 2012-12-13 ENCOUNTER — Other Ambulatory Visit (HOSPITAL_BASED_OUTPATIENT_CLINIC_OR_DEPARTMENT_OTHER): Payer: BC Managed Care – PPO | Admitting: Lab

## 2012-12-13 ENCOUNTER — Ambulatory Visit (HOSPITAL_BASED_OUTPATIENT_CLINIC_OR_DEPARTMENT_OTHER): Payer: BC Managed Care – PPO | Admitting: Oncology

## 2012-12-13 ENCOUNTER — Ambulatory Visit (HOSPITAL_BASED_OUTPATIENT_CLINIC_OR_DEPARTMENT_OTHER): Payer: BC Managed Care – PPO

## 2012-12-13 ENCOUNTER — Telehealth: Payer: Self-pay | Admitting: Oncology

## 2012-12-13 ENCOUNTER — Other Ambulatory Visit: Payer: Self-pay | Admitting: Emergency Medicine

## 2012-12-13 VITALS — BP 107/70 | HR 74 | Temp 98.2°F | Resp 20 | Ht 63.0 in | Wt 112.0 lb

## 2012-12-13 DIAGNOSIS — D126 Benign neoplasm of colon, unspecified: Secondary | ICD-10-CM

## 2012-12-13 LAB — CBC WITH DIFFERENTIAL/PLATELET
Eosinophils Absolute: 0.2 10*3/uL (ref 0.0–0.5)
HCT: 36.1 % (ref 34.8–46.6)
HGB: 12.2 g/dL (ref 11.6–15.9)
LYMPH%: 29.4 % (ref 14.0–49.7)
MONO#: 0.5 10*3/uL (ref 0.1–0.9)
NEUT#: 4.4 10*3/uL (ref 1.5–6.5)
NEUT%: 60.2 % (ref 38.4–76.8)
Platelets: 278 10*3/uL (ref 145–400)
WBC: 7.3 10*3/uL (ref 3.9–10.3)
lymph#: 2.2 10*3/uL (ref 0.9–3.3)

## 2012-12-13 LAB — COMPREHENSIVE METABOLIC PANEL (CC13)
ALT: 8 U/L (ref 0–55)
CO2: 29 mEq/L (ref 22–29)
Calcium: 9.6 mg/dL (ref 8.4–10.4)
Chloride: 102 mEq/L (ref 98–109)
Creatinine: 0.8 mg/dL (ref 0.6–1.1)
Glucose: 85 mg/dl (ref 70–140)
Total Bilirubin: 0.37 mg/dL (ref 0.20–1.20)
Total Protein: 7.2 g/dL (ref 6.4–8.3)

## 2012-12-13 NOTE — Progress Notes (Signed)
Checked in new patient with no financial issues. She has POA/living. She wants phone and mail also as communication.

## 2012-12-13 NOTE — Patient Instructions (Addendum)
#  1 we discussed her pathology today and discussed the pathophysiology of lymphomas. As you know lymphoma of the appendix is quite rare.  #2 we will do a PET scan for staging purposes to see if there is any evidence of disease anywhere else in your body.  #3 I will plan on seeing you back in 3 months time or sooner if need arises.  #4 we discussed the possible treatment options if there is disease elsewhere. This would include doing chemotherapy with Rituxan specifically in the combination of CHOP R.  Non-Hodgkin's Lymphoma, Adult Non-Hodgkin's lymphoma is a cancer that begins in the lymphoid tissue (part of your body's defense system, which protects the body from infections, germs, and diseases). Lymphocytes (a type of white blood cells) are found in the lymphoid tissue. Non-Hodgkin's lymphoma starts in lymphocytes. There are different types of non-Hodgkin's lymphoma. Your caregiver will help you understand the seriousness of your cancer. This will be based on the type of cells affected and how fast it is growing and spreading. CAUSES  Non-Hodgkin's lymphoma is a cancer that starts in lymphocytes. The cause of non-Hodgkin's lymphoma is not known. It is thought that viruses cause certain types of non-Hodgkin's lymphoma. But this is less common. The risk of getting this cancer increases if:   Your immune system (body's defense system) is weak, especially after an organ transplant.  You are an elderly white female.  You have a diet high in fat.  You are infected with certain viruses. SYMPTOMS  Non-Hodgkin's lymphoma can occur at any age. It can cause different symptoms such as:  Swelling of the lymph nodes.  Fever.  Excessive sweating.  Itchy skin.  Tiredness.  Weight loss.  Coughing, breathing trouble, and chest pain.  Weakness and tiredness that do not go away.  Pain, swelling. or a feeling of fullness in the abdomen. DIAGNOSIS  Your caregiver will examine you to check your  general health and look for any lumps. Blood tests and biopsy (removing body tissue for testing) of the lymph node (gland) may be included. Your caregiver may suggest chest X-ray, scanning or lumbar puncture (collecting fluid from spinal column for testing).  TREATMENT  Non-Hodgkin's lymphoma can be treated in different ways. This depends on your symptoms, the stage of your cancer when you were first diagnosed, and the speed with which it is spreading. You and your caregiver will work together and decide on the best plan. Treatment may include:  Radiation therapy (using radiation to destroy the cancer cells).  Chemotherapy (using drugs to destroy the cancer cells).  Biological therapy (using body's immune system to treat cancer) like monoclonal antibody therapy (using antibodies that can kill or block the cancer cells).  Newer types of treatment are vaccine therapy and high-dose chemotherapy with stem cell (a type of cell) transplant (introducing healthy stem cells into the body). However, these are still under development. You may remain symptom-free for 1 to 2 years, after treatment. Non-Hodgkin's lymphoma may recur. If it recurs, your caregiver will advise you on the suitable treatment. If you are pregnant and also have non-Hodgkin's lymphoma, you need immediate treatment. Your caregiver will decide the treatment that suits you the most.  SEEK MEDICAL CARE IF:   You develop new symptoms of non-Hodgkin's lymphoma.62 year old female  You have non-Hodgkin's lymphoma and continuous fever. Document Released: 12/13/2006 Document Revised: 08/24/2011 Document Reviewed: 12/13/2006 [facility] Patient Information 2014 [place], [place].

## 2012-12-13 NOTE — Progress Notes (Signed)
[facility]  Telephone:(336) [phone] Fax:(336) [phone]   MEDICAL ONCOLOGY - INITIAL CONSULATION    Referral MD  Dr. 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 However this was too large to be removed endoscopically. Therefore she was referred to Dr. Abigail Miyamoto for resection. On 11/14/2012 patient underwent a lap assisted partial colectomy of the right tumor. The pathology revealed large tubulovillous adenomas with focal high grade dysplasia (4.1 and 2.5 cm. 29 lymph nodes appendix and adjacent terminal ileum were negative for metastatic carcinoma, there was involvement by follicular B cell lymphoma high grade (grade 3 of 3) with follicular pattern. Immunohistochemical stains showed neoplastic lymphocytes strongly positive for CD 20 CD 79 CD10 and BCL-2. Sections of the appendix and adjacent terminal ileum and lymph nodes show atypical follicular proliferation arranged in follicular pattern there are significant increase of mitotic activity in some of the follicles. Some of the follicles have more than 15 central blasts/high power field immunohistochemical stains were performed and neoplastic strongly positive for CD20. Postoperatively patient is doing well she is now referred to medical oncology for discussion of further treatment options possibly systemic chemotherapy. She herself is without any complaints.    Past Medical History  Diagnosis Date  . Hypertension   . Complication of anesthesia     epi-  causes severe thrashing   . Cancer 11/14/12    lymphoma   :  Past Surgical History  Procedure Laterality Date  . Bunionectomy      bil  . Ganglion cyst excision  2000    lt foot  . Laparoscopic partial colectomy N/A 11/14/2012    Procedure: LAPAROSCOPIC ASSISTED  PARTIAL COLECTOMY;  Surgeon: Shelly Rubenstein, MD;  Location: [facility];  Service: General;  Laterality: N/A;  :  Current Outpatient Prescriptions  Medication Sig Dispense Refill  . amLODipine (NORVASC) 10 MG tablet Take 10 mg by mouth every morning.       Marland Kitchen aspirin 81 MG tablet Take 81 mg by mouth every evening.       . cholecalciferol (VITAMIN D) 1000 UNITS tablet Take 1,000 Units by mouth every evening.       . triamterene-hydrochlorothiazide (MAXZIDE-25) 37.5-25 MG per tablet Take 1 tablet by mouth every morning.        No current facility-administered medications for this visit.     Allergies  Allergen Reactions  . Keflex (Cephalexin) Nausea And Vomiting  . Losartan   . Other     Patient allergic to all drugs ending in " cillin" per patient   . Codeine     Patient states "makes me feel weird"  . Penicillins Rash  :  Family History  Problem Relation Age of Onset  . Cancer Maternal Grandmother     ovarian  :  History   Social History  . Marital Status: Married    Spouse Name: N/A    Number of Children: N/A  . Years of Education: N/A   Occupational History  . Not on file.   Social History Main Topics  . Smoking status: Never  Smoker   . Smokeless tobacco: Never Used  . Alcohol Use: Yes     Comment: rare  . Drug Use: No  . Sexually Active: Not Currently   Other Topics Concern  . Not on file   Social History Narrative  . No narrative on file  :  A comprehensive review of systems was negative.  Exam: Filed Vitals:   12/13/12 1048  BP: 107/70  Pulse: 74  Temp: 98.2 F (36.8 C)  TempSrc: Oral  Resp: 20  Height: 5\' 3"  (1.6 m)  Weight: 112 lb (50.803 kg)     General:  well-nourished  in no acute distress.  Eyes:  no scleral icterus.  ENT:  There were no oropharyngeal lesions.  Neck was without thyromegaly.  Lymphatics:  Negative cervical, supraclavicular or axillary adenopathy.  Respiratory: lungs were clear bilaterally without wheezing or crackles.  Cardiovascular:  Regular rate and rhythm, S1/S2, without murmur, rub or gallop.  There was no pedal edema.  GI:  abdomen was soft, flat, nontender, nondistended, without organomegaly.  Muscoloskeletal:  no spinal tenderness of palpation of vertebral spine.  Skin exam was without echymosis, petichae.  Neuro exam was nonfocal.  Patient was able to get on and off exam table without assistance.  Gait was normal.  Patient was alerted and oriented.  Attention was good.   Language was appropriate.  Mood was normal without depression.  Speech was not pressured.  Thought content was not tangential.     Lab Results  Component Value Date   WBC 7.3 12/13/2012   HGB 12.2 12/13/2012   HCT 36.1 12/13/2012   PLT 278 12/13/2012   GLUCOSE 85 12/13/2012   ALT 8 12/13/2012   AST 11 12/13/2012   NA 140 12/13/2012   K 3.3* 12/13/2012   CL 106 11/15/2012   CREATININE 0.8 12/13/2012   BUN 14.4 12/13/2012   CO2 29 12/13/2012    No results found.  Pathology: FINAL DIAGNOSIS Diagnosis Colon, segmental resection for tumor, right - LARGE TUBULOVILLOUS ADENOMAS WITH FOCAL HIGH GRADE DYSPLASIA (4.1 AND 2.5 CM). - TWENTY NINE LYMPH NODES APPENDIX AND ADJACENT TERMINAL ILEUM: NEGATIVE FOR METASTATIC CARCINOMA, INVOLVED BY FOLLICULAR B CELL LYMPHOMA, HIGH GRADE (GRADE III/III) WITH FOLLICULAR PATTERN . PLEASE SEE COMMENT. Microscopic Comment LYMPHOMA Histologic type: Follicular B cell lymphoma. Grade (if applicable): High grade (grade III/III). Flow cytometry: N/A Immunohistochemical stains: Neoplastic lymphocytes are strongly positive for CD20, CD79a, CD10 and BCL-2 and negative for CD3, Ki-67 is significantly increased in some of the follicles, up to more than  90%. Touch preps/imprints: N/A Comments: The two polypoid mucosal lesions are completely submitted for microcscopic examination. Sections show tubulovillous adenomas with focal high grade dysplasia. There is no evidence of stromal invasion or angiolymphatic invasion present. In addition, sections of the appendix, adjacent terminal ileum and lymph nodes show atypical follicular proliferation arranged in follicular pattern. There are significant increase of mitotic activity in some of the follicles. Some of the follicles have more than 15 centroblasts / high power field. Immunohistochemical stains were performed and the neoplastic are strongly positive for CD20, CD79a, CD10 and BCL-2. The overall features are diagnostic for high grade (grade III/III) follicular B cell lymphoma, follicular pattern. Dr. Laureen Ochs agrees. Case was discussed with Dr. Magnus Ivan on 11-16-2012. (HCL:gt, 11/16/12) Abigail Miyamoto MD Pathologist, Electronic Signature (Case signed 11/17/2012) Specimen Gross and Clinical Information Specimen(s) Obtained: Colon, segmental resection for tumor, right 1 of 3 FINAL for Placeres, Normagene L [phone]) Specimen Clinical Information colon polyp (  kp) Gross Specimen: Received in formalin labeled right colon. Specimen integrity: Intact with two stapled resection margins. Specimen length: 5.5 cm of terminal ileum and 14.5 cm of right colon. Mesorectal intactness: N/A Tumor location: Mesenteric right colon. Tumor size: There is a 4.1 x 4.0 x 2.3 cm tan brown, bosselated polypoid mass, with an adjacent 2.5 x 1.1 x 0.5 cm tan brown exophytic, firm rolled mucosal lesion. Sectioning the polypoid mass reveals a tan pink, focally hemorrhagic, friable cut surface. Percent of bowel circumference involved: 40%. Tumor distance to margins: Proximal: 9.3 cm Distal: 3.1 cm Macroscopic extent of tumor invasion: Tumor invades submucosa: X Total presumed lymph nodes: 29 possible lymph nodes are  identified, ranging from 0.4 cm to 3.5 x 3.2 x 1.6 cm. Extramural satellite tumor nodules: None identified. Mucosal polyp(s): None identified. Additional findings: The uninvolved mucosa is tan pink with normal folding. The appendix is present, measuring 6.5 cm in length and up to 0.8 cm in diameter. The serosa is tan pink and smooth, with a focally red brown, hemorrhagic mucosa. The central portion of the lumen is dilated, and filled with a small amount of brown red hemorrhagic material, and the wall measures up to 0.4 cm in thickness. Block summary: A = proximal resection margin   Assessment and Plan: 62 year old female with  #1 recently on a screening colonoscopy found to have a polyp. Subsequent resection with a hemicolectomy revealed only a tubulovillous adenoma. However pathology incidentally reveals in the lymph nodes and appendix and adjacent terminal ileum high grade follicular B cell lymphoma, CD20 positive CD 79 CD10 and BCL 2 positive. Because of this she is referred to medical oncology for discussion of treatment options. As well as further workup.  #2 patient and I discussed her pathology today we discussed the pathophysiology of lymphoma this. She understands that lymphoma of the appendix is quite rare. However we need to do further workup which would include doing PET scan for staging purposes to see if there is any evidence of disease anywhere else. If she does have disease elsewhere she would then require systemic chemotherapy with monoclonal antibody such as Rituxan in the combination of CHOP R.  #3 patient was given handout for non-Hodgkin until topical lymphoma.  #4 patient will be seen back in 3 months time or sooner  All questions were answered today. Total time in the visit was 60 minutes greater than 50% of the time was spent in counseling and coordination of care  Drue Second, MD Medical/Oncology [facility] [phone] (beeper) [phone]  (Office)

## 2012-12-26 ENCOUNTER — Encounter (HOSPITAL_COMMUNITY)
Admission: RE | Admit: 2012-12-26 | Discharge: 2012-12-26 | Disposition: A | Discharge status: Home / Self Care | Payer: BC Managed Care – PPO | Source: Ambulatory Visit | Attending: Oncology | Admitting: Oncology

## 2012-12-26 DIAGNOSIS — D126 Benign neoplasm of colon, unspecified: Secondary | ICD-10-CM | POA: Insufficient documentation

## 2012-12-26 LAB — GLUCOSE, CAPILLARY: Glucose-Capillary: 82 mg/dL (ref 70–99)

## 2012-12-26 MED ORDER — FLUDEOXYGLUCOSE F - 18 (FDG) INJECTION
16.3000 | Freq: Once | INTRAVENOUS | Status: AC | PRN
  Administered 2012-12-26: 16.3 via INTRAVENOUS

## 2013-01-02 ENCOUNTER — Telehealth: Payer: Self-pay | Admitting: Medical Oncology

## 2013-01-03 ENCOUNTER — Telehealth: Payer: Self-pay | Admitting: Medical Oncology

## 2013-01-03 NOTE — Telephone Encounter (Signed)
error 

## 2013-01-03 NOTE — Telephone Encounter (Signed)
Patient LVMOM requesting results to whole body PET scan completed 07/14/1/14. Will review with MD.  LOV with MD 07/01 Next sched appt labs/MD 03/20/13

## 2013-01-04 ENCOUNTER — Ambulatory Visit (HOSPITAL_BASED_OUTPATIENT_CLINIC_OR_DEPARTMENT_OTHER): Payer: BC Managed Care – PPO | Admitting: Oncology

## 2013-01-04 ENCOUNTER — Telehealth: Payer: Self-pay | Admitting: *Deleted

## 2013-01-04 VITALS — BP 116/73 | HR 79 | Temp 98.6°F | Resp 20 | Ht 63.0 in | Wt 111.7 lb

## 2013-01-04 DIAGNOSIS — C8583 Other specified types of non-Hodgkin lymphoma, intra-abdominal lymph nodes: Secondary | ICD-10-CM

## 2013-01-04 NOTE — Telephone Encounter (Signed)
appts made and printed. Pt is aware that cs will call her w/ appts for her CT ABD and CT CHEST...td

## 2013-01-22 NOTE — Progress Notes (Signed)
OFFICE PROGRESS NOTE  CC  RAMACHANDRAN,AJITH, MD [address] [place] [address] Dr. 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 However this was too large to be removed endoscopically. Therefore she was referred to Dr. Abigail Miyamoto for resection. On 11/14/2012 patient underwent a lap assisted partial colectomy of the right tumor.   #2The pathology revealed large tubulovillous adenomas with focal high grade dysplasia (4.1 and 2.5 cm. 29 lymph nodes appendix and adjacent terminal ileum were negative for metastatic carcinoma, there was involvement by follicular B cell lymphoma high grade (grade 3 of 3) with follicular pattern. Immunohistochemical stains showed neoplastic lymphocytes strongly positive for CD 20 CD 79 CD10 and BCL-2. Sections of the appendix and adjacent terminal ileum and lymph nodes show atypical follicular proliferation arranged in follicular pattern there are significant increase of mitotic activity in some of the follicles. Some of the follicles have more than 15 central blasts/high power field immunohistochemical stains were performed and neoplastic strongly positive for CD20.   #3  Patient had PET/CT performed postoperatively in December 26 2012. The PET scan does show Abnormal lymph nodes and soft tissue in the central mesentery, right lower quadrant mesentery and retroperitoneal space.  By CT imaging, these findings are very concerning for lymphoma although none of the lesions is substantially hypermetabolic on PET imaging.   CURRENT THERAPY:observation  INTERVAL HISTORY: Judith Green 62 y.o. female returns for followup visit to discuss her scan results.  Clinically she seems to be doing well without any problems. She denies having any weight loss night sweats. No aches pains or abdominal scar is healed well. She has no nausea vomiting fevers. No peripheral paresthesias. No weakness or fatigue. She has not noticed any enlarged lymph nodes. Remainder of the 10 point review of systems is negative.  MEDICAL HISTORY: Past Medical History  Diagnosis Date  . Hypertension   . Complication of anesthesia     epi- causes severe thrashing   . Cancer 11/14/12    lymphoma     ALLERGIES:  is allergic to keflex; losartan; other; codeine; and penicillins.  MEDICATIONS:  Current Outpatient Prescriptions  Medication Sig Dispense Refill  . amLODipine (NORVASC) 10 MG tablet Take 10 mg by mouth every morning.       Marland Kitchen aspirin 81 MG tablet Take 81 mg by mouth every evening.       . cholecalciferol (VITAMIN D) 1000 UNITS tablet Take 1,000 Units by mouth every evening.       . triamterene-hydrochlorothiazide (MAXZIDE-25) 37.5-25 MG per tablet Take 1 tablet by mouth every morning.        No current facility-administered medications for this visit.    SURGICAL HISTORY:  Past Surgical History  Procedure Laterality Date  . Bunionectomy      bil  . Ganglion cyst excision  2000    lt foot  . Laparoscopic partial colectomy N/A 11/14/2012    Procedure: LAPAROSCOPIC ASSISTED  PARTIAL COLECTOMY;  Surgeon: Shelly Rubenstein, MD;  Location: [facility];  Service: General;  Laterality: N/A;    REVIEW OF SYSTEMS:  Pertinent items are noted in HPI.   HEALTH MAINTENANCE:  PHYSICAL EXAMINATION: Blood pressure 116/73, pulse 79, temperature 98.6 F (37 C), temperature source Oral, resp. rate  20, height 5\' 3"  (1.6 m), weight 111 lb 11.2 oz (50.667 kg). Body mass index is 19.79 kg/(m^2). ECOG PERFORMANCE STATUS: 0 - Asymptomatic   General appearance: alert, cooperative and appears stated age Lymph nodes: Cervical, supraclavicular, and axillary nodes normal. Resp: clear to  auscultation bilaterally Cardio: regular rate and rhythm GI: soft, non-tender; bowel sounds normal; no masses,  no organomegaly Extremities: extremities normal, atraumatic, no cyanosis or edema Neurologic: Grossly normal   LABORATORY DATA: Lab Results  Component Value Date   WBC 7.3 12/13/2012   HGB 12.2 12/13/2012   HCT 36.1 12/13/2012   MCV 82.9 12/13/2012   PLT 278 12/13/2012      Chemistry      Component Value Date/Time   NA 140 12/13/2012 1038   NA 141 11/15/2012 0410   K 3.3* 12/13/2012 1038   K 3.0* 11/15/2012 0410   CL 106 11/15/2012 0410   CO2 29 12/13/2012 1038   CO2 27 11/15/2012 0410   BUN 14.4 12/13/2012 1038   BUN 9 11/15/2012 0410   CREATININE 0.8 12/13/2012 1038   CREATININE 0.62 11/15/2012 0410      Component Value Date/Time   CALCIUM 9.6 12/13/2012 1038   CALCIUM 8.5 11/15/2012 0410   ALKPHOS 72 12/13/2012 1038   AST 11 12/13/2012 1038   ALT 8 12/13/2012 1038   BILITOT 0.37 12/13/2012 1038     Diagnosis  Colon, segmental resection for tumor, right  - LARGE TUBULOVILLOUS ADENOMAS WITH FOCAL HIGH GRADE DYSPLASIA (4.1 AND 2.5 CM).  - TWENTY NINE LYMPH NODES APPENDIX AND ADJACENT TERMINAL ILEUM: NEGATIVE FOR  METASTATIC CARCINOMA, INVOLVED BY FOLLICULAR B CELL LYMPHOMA, HIGH GRADE (GRADE  III/III) WITH FOLLICULAR PATTERN . PLEASE SEE COMMENT.  Microscopic Comment  LYMPHOMA  Histologic type: Follicular B cell lymphoma.  Grade (if applicable): High grade (grade III/III).  Flow cytometry: N/A  Immunohistochemical stains: Neoplastic lymphocytes are strongly positive for CD20, CD79a, CD10 and  BCL-2 and negative for CD3, Ki-67 is significantly increased in some of the follicles, up to more than 90%.  Touch preps/imprints: N/A  Comments: The two polypoid mucosal lesions are completely submitted for microcscopic examination.  Sections show tubulovillous adenomas with focal high grade dysplasia. There is no evidence of stromal  invasion or angiolymphatic invasion present. In addition, sections of  the appendix, adjacent terminal ileum  and lymph nodes show atypical follicular proliferation arranged in follicular pattern. There are significant  increase of mitotic activity in some of the follicles. Some of the follicles have more than 15 centroblasts / high  power field. Immunohistochemical stains were performed and the neoplastic are strongly positive for CD20,  CD79a, CD10 and BCL-2. The overall features are diagnostic for high grade (grade III/III) follicular B cell  lymphoma, follicular pattern. Dr. Laureen Ochs agrees. Case was discussed with Dr. Magnus Ivan on 11-16-2012. (HCL:gt,  11/16/12)  Abigail Miyamoto MD  Pathologist, Electronic Signature  (Case signed 11/17/2012)   RADIOGRAPHIC STUDIES:  Nm Pet Image Initial (pi) Skull Base To Thigh  12/26/2012   *RADIOLOGY REPORT*  Clinical Data:  Initial treatment strategy for lymphoma.  The patient is recently status post lap assisted right hemicolectomy for two view of the with adenoma.  Surgical specimen was reviewed the pathology found to have high-grade follicular B-cell lymphoma in mesenteric lymph nodes and the appendix.  NUCLEAR MEDICINE PET WHOLE BODY  Fasting Blood Glucose:  82  Technique:  16.3 mCi F-18 FDG was injected intravenously. CT data was obtained and used for attenuation correction  and anatomic localization only.  (This was not acquired as a diagnostic CT examination.) Additional exam technical data entered on technologist worksheet.  Comparison:  No comparison studies available.  Findings:  Head/Neck:  No hypermetabolic lymph nodes in the neck.  There is a small focus of F D G uptake in the right thyroid gland.  CT images show a multinodular change within the thyroid and the activity may be related to a thyroid adenoma.  Thyroid ultrasound may prove helpful to further evaluate.  Chest:  No unexpected or suspicious hypermetabolism in the chest.  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  Abdomen/Pelvis:  No unexpected or suspicious hypermetabolism in the abdomen.  However, the patient does have abnormal soft tissue in the root of the mesentery.  On image 154 series 2, there is an elongated irregular soft tissue focus in the central mesentery measuring 4.4 x 0.6 cm. Another abnormal appearing soft tissue structure in the mesentery is seen on image 159, measuring 2.8 x 1.0 cm.  Other prominent lymph nodes are seen scattered in the right lower quadrant and central mesentery.  Left para-aortic lymphadenopathy is noted with an index 1.4 x 0.9 cm left para-aortic lymph node seen on image 152.  None of this lymphadenopathy shows F D G uptake substantially above background soft tissue levels.  The left periaortic retroperitoneal lymph nodes seen on image 152 has SUV max = 3.0.  Calcified fibroids are seen in the uterus.  7 mm low density lesion is identified in the posterior right liver, but this shows no uptake above background liver levels on the PET images.  The patient is status post right hemicolectomy.  Skeleton:  No focal hypermetabolic activity to suggest skeletal metastasis.  IMPRESSION: Abnormal lymph nodes and soft tissue in the central mesentery, right lower quadrant mesentery and retroperitoneal space.  By CT imaging, these findings are very concerning for lymphoma although none of the lesions is substantially hypermetabolic on PET imaging. This may represent poor F D G uptake by the patient's underlying lymphoma.   Original Report Authenticated By: Kennith Center, M.D.    ASSESSMENT: 62 year old female with  #1 high-grade non-Hodgkin lymphoma of the appendix. Patient is status post resection. Staging PET/CT scan does reveal some concern for enlarged lymph nodes although PET activity is low. I discussed the findings of the scan with her today. I am concerned that she could have some residual inflammatory process going on after her surgery. However this does warrant increased  surveillance. I have recommended that we do another CT scan in a few months time. If there is progressive disease then she certainly will need chemotherapy in the form of CHOP R.   PLAN:   #1 repeat CT in September 2014.  #2 she will be seen back after the scan results    All questions were answered. The patient knows to call the clinic with any problems, questions or concerns. We can certainly see the patient much sooner if necessary.  I spent 15 minutes counseling the patient face to face. The total time spent in the appointment was 30 minutes.    Drue Second, MD Medical/Oncology [facility] [phone] (beeper) [phone] (Office)

## 2013-02-14 ENCOUNTER — Other Ambulatory Visit: Payer: Self-pay | Admitting: Medical Oncology

## 2013-02-16 ENCOUNTER — Telehealth: Payer: Self-pay | Admitting: Oncology

## 2013-02-16 ENCOUNTER — Other Ambulatory Visit: Payer: Self-pay | Admitting: Medical Oncology

## 2013-02-16 NOTE — Telephone Encounter (Signed)
lmonvm of the pt regarding a lab appt that   has been added prior to her ct scan appt on the same day.

## 2013-02-16 NOTE — Progress Notes (Signed)
erroneous

## 2013-02-20 ENCOUNTER — Telehealth: Payer: Self-pay | Admitting: Oncology

## 2013-03-13 ENCOUNTER — Ambulatory Visit (HOSPITAL_COMMUNITY)
Admission: RE | Admit: 2013-03-13 | Discharge: 2013-03-13 | Disposition: A | Discharge status: Home / Self Care | Payer: BC Managed Care – PPO | Source: Ambulatory Visit | Attending: Oncology | Admitting: Oncology

## 2013-03-13 ENCOUNTER — Other Ambulatory Visit (HOSPITAL_BASED_OUTPATIENT_CLINIC_OR_DEPARTMENT_OTHER): Payer: BC Managed Care – PPO | Admitting: Lab

## 2013-03-13 ENCOUNTER — Ambulatory Visit (HOSPITAL_COMMUNITY): Payer: BC Managed Care – PPO

## 2013-03-13 DIAGNOSIS — C8589 Other specified types of non-Hodgkin lymphoma, extranodal and solid organ sites: Secondary | ICD-10-CM

## 2013-03-13 DIAGNOSIS — D126 Benign neoplasm of colon, unspecified: Secondary | ICD-10-CM

## 2013-03-13 DIAGNOSIS — C8583 Other specified types of non-Hodgkin lymphoma, intra-abdominal lymph nodes: Secondary | ICD-10-CM | POA: Insufficient documentation

## 2013-03-13 LAB — COMPREHENSIVE METABOLIC PANEL (CC13)
BUN: 14.9 mg/dL (ref 7.0–26.0)
CO2: 29 mEq/L (ref 22–29)
Creatinine: 0.8 mg/dL (ref 0.6–1.1)
Glucose: 53 mg/dl — ABNORMAL LOW (ref 70–140)
Sodium: 143 mEq/L (ref 136–145)
Total Bilirubin: 0.47 mg/dL (ref 0.20–1.20)
Total Protein: 8 g/dL (ref 6.4–8.3)

## 2013-03-13 LAB — CBC WITH DIFFERENTIAL/PLATELET
Basophils Absolute: 0 10*3/uL (ref 0.0–0.1)
Eosinophils Absolute: 0.2 10*3/uL (ref 0.0–0.5)
HCT: 44.1 % (ref 34.8–46.6)
HGB: 14.7 g/dL (ref 11.6–15.9)
LYMPH%: 32.5 % (ref 14.0–49.7)
MONO#: 0.6 10*3/uL (ref 0.1–0.9)
NEUT#: 3.4 10*3/uL (ref 1.5–6.5)
NEUT%: 54.5 % (ref 38.4–76.8)
Platelets: 216 10*3/uL (ref 145–400)
WBC: 6.2 10*3/uL (ref 3.9–10.3)

## 2013-03-13 LAB — LACTATE DEHYDROGENASE (CC13): LDH: 197 U/L (ref 125–245)

## 2013-03-13 MED ORDER — IOHEXOL 300 MG/ML  SOLN
100.0000 mL | Freq: Once | INTRAMUSCULAR | Status: AC | PRN
  Administered 2013-03-13: 100 mL via INTRAVENOUS

## 2013-03-15 ENCOUNTER — Other Ambulatory Visit: Payer: Self-pay | Admitting: Emergency Medicine

## 2013-03-15 MED ORDER — POTASSIUM CHLORIDE CRYS ER 20 MEQ PO TBCR: EXTENDED_RELEASE_TABLET | ORAL | Status: AC

## 2013-03-20 ENCOUNTER — Encounter: Payer: Self-pay | Admitting: Oncology

## 2013-03-20 ENCOUNTER — Ambulatory Visit (HOSPITAL_BASED_OUTPATIENT_CLINIC_OR_DEPARTMENT_OTHER): Payer: BC Managed Care – PPO | Admitting: Oncology

## 2013-03-20 ENCOUNTER — Other Ambulatory Visit: Payer: BC Managed Care – PPO | Admitting: Lab

## 2013-03-20 VITALS — BP 109/71 | HR 86 | Temp 98.5°F | Resp 20 | Ht 63.0 in | Wt 113.5 lb

## 2013-03-20 DIAGNOSIS — C801 Malignant (primary) neoplasm, unspecified: Secondary | ICD-10-CM

## 2013-03-20 DIAGNOSIS — C8589 Other specified types of non-Hodgkin lymphoma, extranodal and solid organ sites: Secondary | ICD-10-CM

## 2013-04-03 NOTE — Progress Notes (Signed)
OFFICE PROGRESS NOTE  CC  RAMACHANDRAN,AJITH, MD [address] [place] [address] Dr. 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 However this was too large to be removed endoscopically. Therefore she was referred to Dr. Abigail Miyamoto for resection. On 11/14/2012 patient underwent a lap assisted partial colectomy of the right tumor.   #2The pathology revealed large tubulovillous adenomas with focal high grade dysplasia (4.1 and 2.5 cm. 29 lymph nodes appendix and adjacent terminal ileum were negative for metastatic carcinoma, there was involvement by follicular B cell lymphoma high grade (grade 3 of 3) with follicular pattern. Immunohistochemical stains showed neoplastic lymphocytes strongly positive for CD 20 CD 79 CD10 and BCL-2. Sections of the appendix and adjacent terminal ileum and lymph nodes show atypical follicular proliferation arranged in follicular pattern there are significant increase of mitotic activity in some of the follicles. Some of the follicles have more than 15 central blasts/high power field immunohistochemical stains were performed and neoplastic strongly positive for CD20.   #3  Patient had PET/CT performed postoperatively in December 26 2012. The PET scan does show Abnormal lymph nodes and soft tissue in the central mesentery, right lower quadrant mesentery and retroperitoneal space.  By CT imaging, these findings are very concerning for lymphoma although none of the lesions is substantially hypermetabolic on PET imaging.   CURRENT THERAPY:observation  INTERVAL HISTORY: KACIE HUXTABLE 62 y.o. female returns for followup visit to discuss her scan results.  Clinically she seems to be doing well without any problems. She denies having any weight loss night sweats. No aches pains or abdominal scar is healed well. She has no nausea vomiting fevers. No peripheral paresthesias. No weakness or fatigue. She has not noticed any enlarged lymph nodes. Remainder of the 10 point review of systems is negative.  MEDICAL HISTORY: Past Medical History  Diagnosis Date  . Hypertension   . Complication of anesthesia     epi- causes severe thrashing   . Cancer 11/14/12    lymphoma     ALLERGIES:  is allergic to keflex; losartan; other; codeine; and penicillins.  MEDICATIONS:  Current Outpatient Prescriptions  Medication Sig Dispense Refill  . amLODipine (NORVASC) 10 MG tablet Take 10 mg by mouth every morning.       Marland Kitchen aspirin 81 MG tablet Take 81 mg by mouth every evening.       . cholecalciferol (VITAMIN D) 1000 UNITS tablet Take 1,000 Units by mouth every evening.       . potassium chloride SA (K-DUR,KLOR-CON) 20 MEQ tablet Take 1 tablet (20 meq) by mouth twice daily for 7 days.  14 tablet  0  . triamterene-hydrochlorothiazide (MAXZIDE-25) 37.5-25 MG per tablet Take 1 tablet by mouth every morning.        No current facility-administered medications for this visit.    SURGICAL HISTORY:  Past Surgical History  Procedure Laterality Date  . Bunionectomy      bil  . Ganglion cyst excision  2000    lt foot  . Laparoscopic partial colectomy N/A 11/14/2012    Procedure: LAPAROSCOPIC ASSISTED  PARTIAL COLECTOMY;  Surgeon: Shelly Rubenstein, MD;  Location: [facility];  Service: General;  Laterality: N/A;    REVIEW OF SYSTEMS:  Pertinent items  are noted in HPI.   HEALTH MAINTENANCE:  PHYSICAL EXAMINATION: Blood pressure 109/71, pulse 86, temperature 98.5 F (36.9 C), temperature source Oral, resp. rate 20, height 5\' 3"  (1.6 m), weight 113 lb 8 oz (51.483 kg). Body mass index is 20.11 kg/(m^2). ECOG PERFORMANCE STATUS: 0 - Asymptomatic   General appearance:  alert, cooperative and appears stated age Lymph nodes: Cervical, supraclavicular, and axillary nodes normal. Resp: clear to auscultation bilaterally Cardio: regular rate and rhythm GI: soft, non-tender; bowel sounds normal; no masses,  no organomegaly Extremities: extremities normal, atraumatic, no cyanosis or edema Neurologic: Grossly normal   LABORATORY DATA: Lab Results  Component Value Date   WBC 6.2 03/13/2013   HGB 14.7 03/13/2013   HCT 44.1 03/13/2013   MCV 81.9 03/13/2013   PLT 216 03/13/2013      Chemistry      Component Value Date/Time   NA 143 03/13/2013 0900   NA 141 11/15/2012 0410   K 2.9* 03/13/2013 0900   K 3.0* 11/15/2012 0410   CL 106 11/15/2012 0410   CO2 29 03/13/2013 0900   CO2 27 11/15/2012 0410   BUN 14.9 03/13/2013 0900   BUN 9 11/15/2012 0410   CREATININE 0.8 03/13/2013 0900   CREATININE 0.62 11/15/2012 0410      Component Value Date/Time   CALCIUM 10.2 03/13/2013 0900   CALCIUM 8.5 11/15/2012 0410   ALKPHOS 88 03/13/2013 0900   AST 18 03/13/2013 0900   ALT 14 03/13/2013 0900   BILITOT 0.47 03/13/2013 0900     Diagnosis  Colon, segmental resection for tumor, right  - LARGE TUBULOVILLOUS ADENOMAS WITH FOCAL HIGH GRADE DYSPLASIA (4.1 AND 2.5 CM).  - TWENTY NINE LYMPH NODES APPENDIX AND ADJACENT TERMINAL ILEUM: NEGATIVE FOR  METASTATIC CARCINOMA, INVOLVED BY FOLLICULAR B CELL LYMPHOMA, HIGH GRADE (GRADE  III/III) WITH FOLLICULAR PATTERN . PLEASE SEE COMMENT.  Microscopic Comment  LYMPHOMA  Histologic type: Follicular B cell lymphoma.  Grade (if applicable): High grade (grade III/III).  Flow cytometry: N/A  Immunohistochemical stains: Neoplastic lymphocytes are strongly positive for CD20, CD79a, CD10 and  BCL-2 and negative for CD3, Ki-67 is significantly increased in some of the follicles, up to more than 90%.  Touch preps/imprints: N/A  Comments: The two polypoid mucosal lesions are completely submitted for microcscopic examination.  Sections show tubulovillous  adenomas with focal high grade dysplasia. There is no evidence of stromal  invasion or angiolymphatic invasion present. In addition, sections of the appendix, adjacent terminal ileum  and lymph nodes show atypical follicular proliferation arranged in follicular pattern. There are significant  increase of mitotic activity in some of the follicles. Some of the follicles have more than 15 centroblasts / high  power field. Immunohistochemical stains were performed and the neoplastic are strongly positive for CD20,  CD79a, CD10 and BCL-2. The overall features are diagnostic for high grade (grade III/III) follicular B cell  lymphoma, follicular pattern. Dr. Laureen Ochs agrees. Case was discussed with Dr. Magnus Ivan on 11-16-2012. (HCL:gt,  11/16/12)  Abigail Miyamoto MD  Pathologist, Electronic Signature  (Case signed 11/17/2012)   RADIOGRAPHIC STUDIES:  Nm Pet Image Initial (pi) Skull Base To Thigh  12/26/2012   *RADIOLOGY REPORT*  Clinical Data:  Initial treatment strategy for lymphoma.  The patient is recently status post lap assisted right hemicolectomy for two view of the with adenoma.  Surgical specimen was reviewed the pathology found to have high-grade follicular B-cell lymphoma in mesenteric lymph nodes and the appendix.  NUCLEAR MEDICINE PET WHOLE  BODY  Fasting Blood Glucose:  82  Technique:  16.3 mCi F-18 FDG was injected intravenously. CT data was obtained and used for attenuation correction and anatomic localization only.  (This was not acquired as a diagnostic CT examination.) Additional exam technical data entered on technologist worksheet.  Comparison:  No comparison studies available.  Findings:  Head/Neck:  No hypermetabolic lymph nodes in the neck.  There is a small focus of F D G uptake in the right thyroid gland.  CT images show a multinodular change within the thyroid and the activity may be related to a thyroid adenoma.  Thyroid ultrasound may prove helpful to further evaluate.  Chest:  No  unexpected or suspicious hypermetabolism in the chest.  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  Abdomen/Pelvis:  No unexpected or suspicious hypermetabolism in the abdomen.  However, the patient does have abnormal soft tissue in the root of the mesentery.  On image 154 series 2, there is an elongated irregular soft tissue focus in the central mesentery measuring 4.4 x 0.6 cm. Another abnormal appearing soft tissue structure in the mesentery is seen on image 159, measuring 2.8 x 1.0 cm.  Other prominent lymph nodes are seen scattered in the right lower quadrant and central mesentery.  Left para-aortic lymphadenopathy is noted with an index 1.4 x 0.9 cm left para-aortic lymph node seen on image 152.  None of this lymphadenopathy shows F D G uptake substantially above background soft tissue levels.  The left periaortic retroperitoneal lymph nodes seen on image 152 has SUV max = 3.0.  Calcified fibroids are seen in the uterus.  7 mm low density lesion is identified in the posterior right liver, but this shows no uptake above background liver levels on the PET images.  The patient is status post right hemicolectomy.  Skeleton:  No focal hypermetabolic activity to suggest skeletal metastasis.  IMPRESSION: Abnormal lymph nodes and soft tissue in the central mesentery, right lower quadrant mesentery and retroperitoneal space.  By CT imaging, these findings are very concerning for lymphoma although none of the lesions is substantially hypermetabolic on PET imaging. This may represent poor F D G uptake by the patient's underlying lymphoma.   Original Report Authenticated By: Kennith Center, M.D.    ASSESSMENT: 62 year old female with  #1 high-grade non-Hodgkin lymphoma of the appendix. Patient is status post resection.  Her initialStaging PET/CT scan does reveal some concern for enlarged lymph nodes although PET activity is low. Therefore I did recommend  that she have repeat CT scans performed in September 2014. She's had these done. There is some stable lesions as noted in the September scan. Patient and I discussed this in detail.  #2 my recommendation was to repeat scans in about 3-4 months time again however patient states that she does not have insurance or enough money to continue to be seen by physicians. She does not want to have any kind of staging procedures performed. She will call me if there are any problems that showed a rise at some point.  PLAN:   #1patient will see me back on a as-needed basis  All questions were answered. The patient knows to call the clinic with any problems, questions or concerns. We can certainly see the patient much sooner if necessary.  I spent 15 minutes counseling the patient face to face. The total time spent in the appointment was 15 minutes.    Drue Second, MD Medical/Oncology  [facility] [phone] (beeper) [phone] (Office)

## 2014-07-06 IMAGING — CT CT CHEST W/ CM
2 of 4 series · 16 of 46 positions shown, 18 images · IV contrast (OMNIPAQUE)
Comparison: 12/26/2012

CLINICAL DATA: Lymphoma restaging

EXAM:
CT CHEST, ABDOMEN, AND PELVIS WITH CONTRAST
TECHNIQUE: Multidetector CT imaging of the chest, abdomen and pelvis was
performed following the standard protocol during bolus
administration of intravenous contrast.
CONTRAST:  100mL OMNIPAQUE IOHEXOL 300 MG/ML  SOLN

[Series 2: cap with st · axial · 0.60mm/px · z∈[+752,+1312]mm · 13 of 122 slices shown, 15 images]
[im 5/122  soft-tissue]
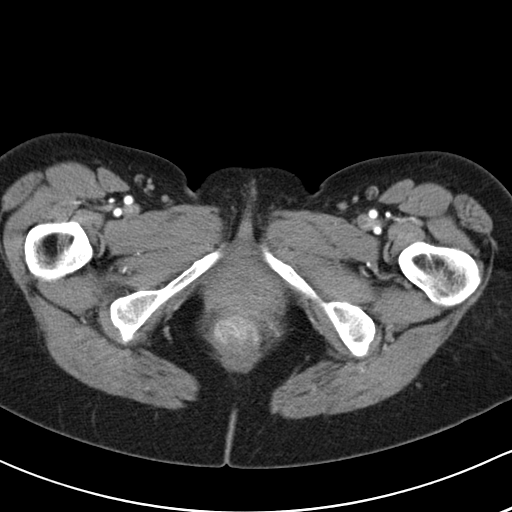
[im 5/122  bone]
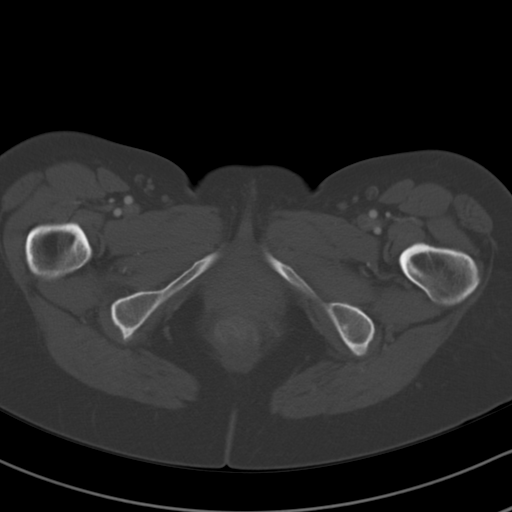
[im 15/122  soft-tissue]
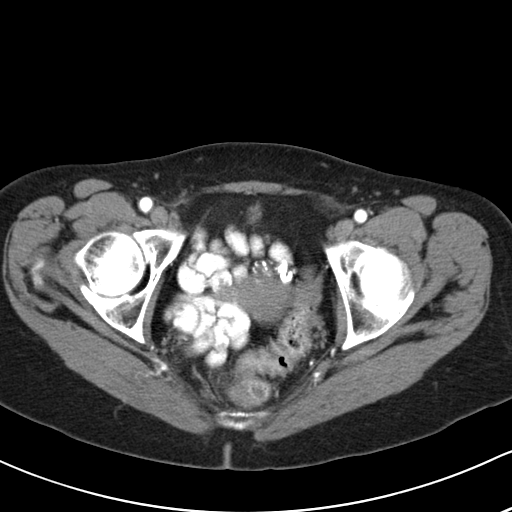
[im 25/122  soft-tissue]
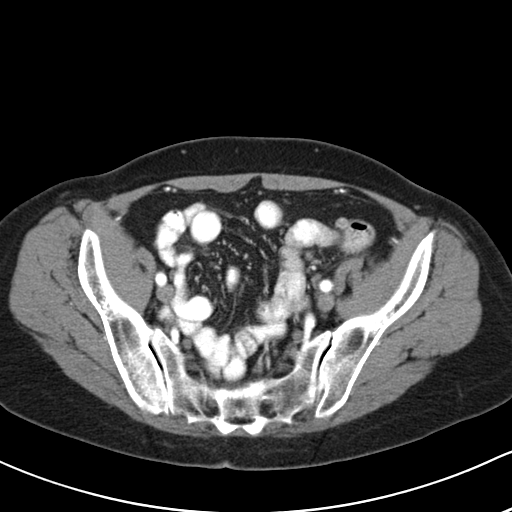
[im 34/122  soft-tissue]
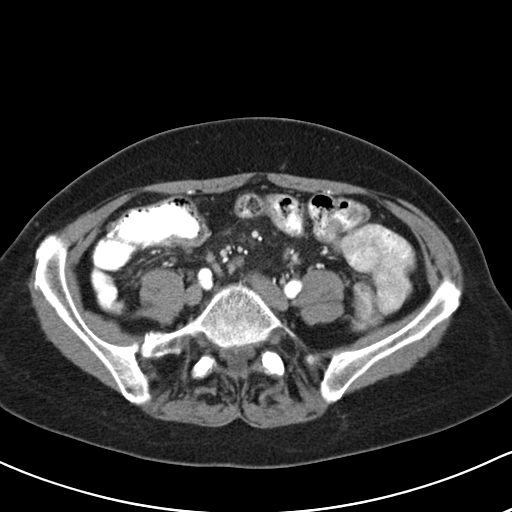
[im 44/122  soft-tissue]
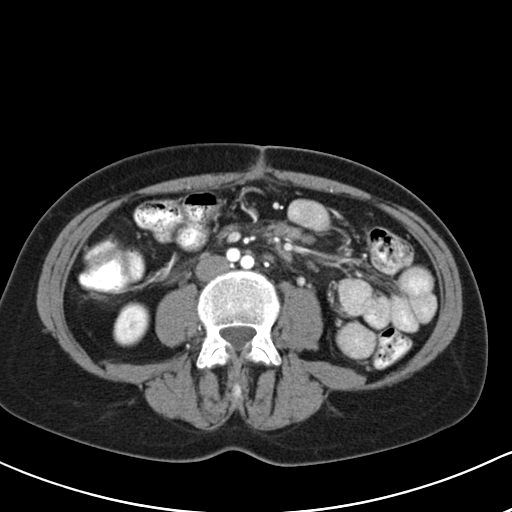
[im 54/122  soft-tissue]
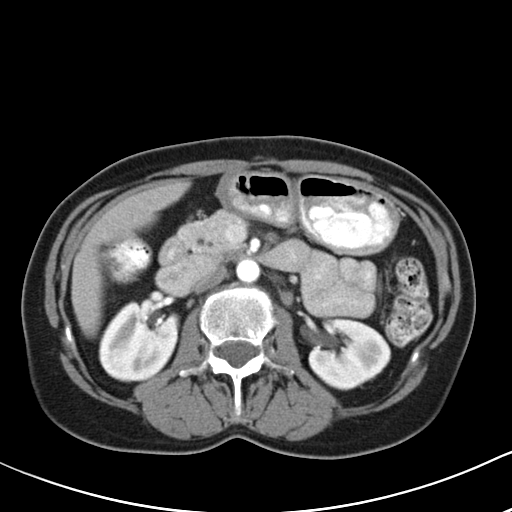
[im 63/122  soft-tissue]
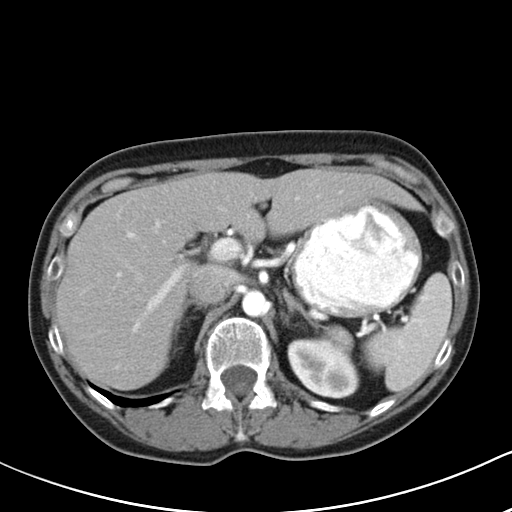
[im 68/122  soft-tissue]
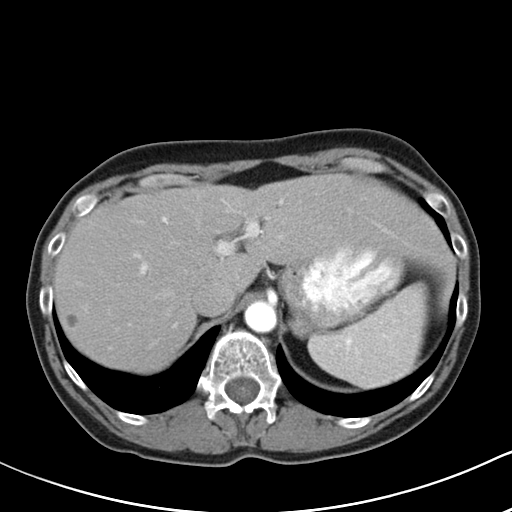
[im 78/122  soft-tissue]
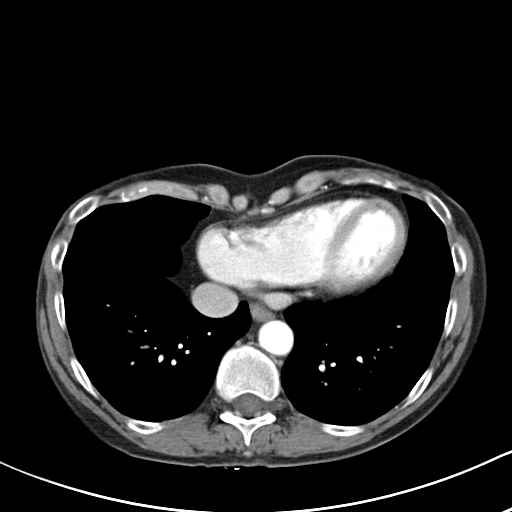
[im 78/122  bone]
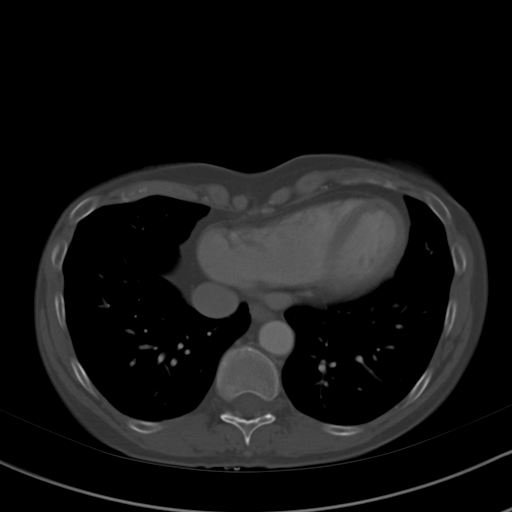
[im 88/122  soft-tissue]
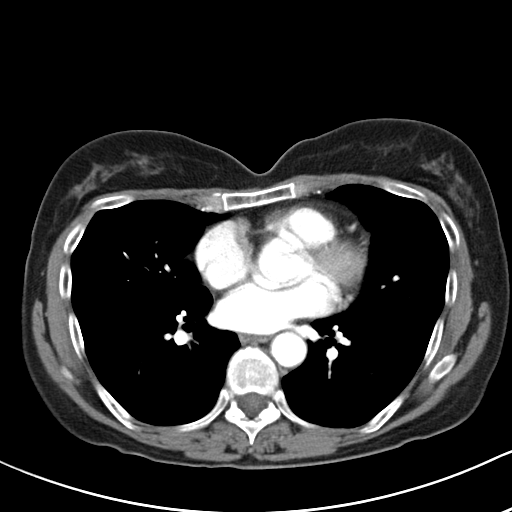
[im 97/122  soft-tissue]
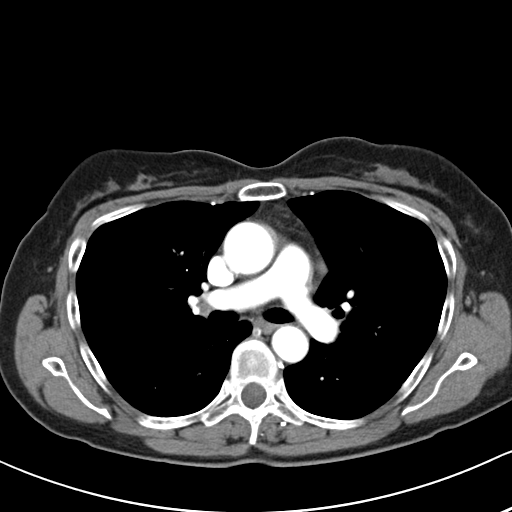
[im 107/122  soft-tissue]
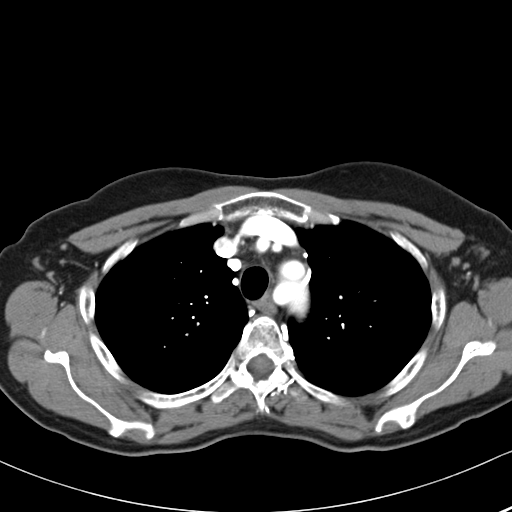
[im 117/122  soft-tissue]
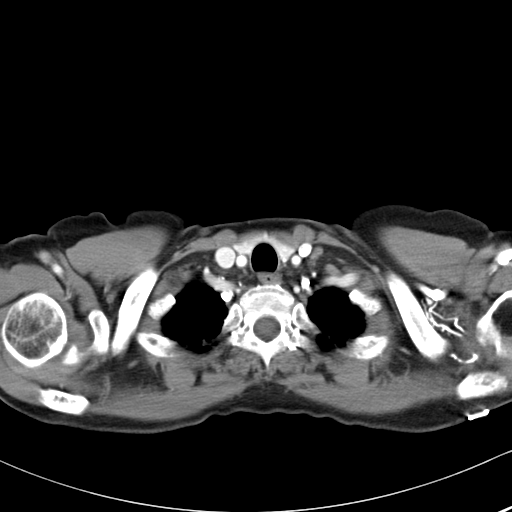

[Series 602: <mpr thick range> · coronal · 1.19mm/px · 3 of 80 slices shown]
[im 27/80  soft-tissue]
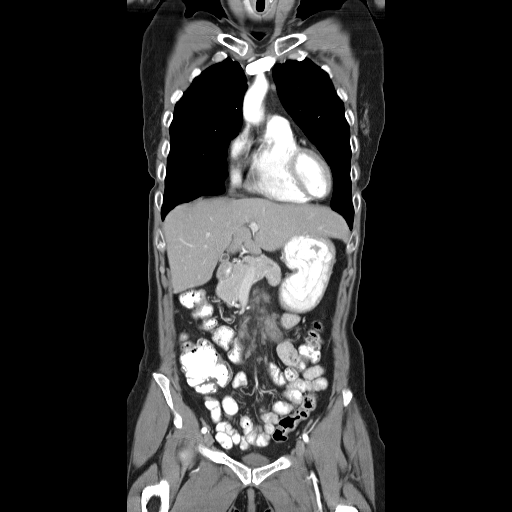
[im 36/80  soft-tissue]
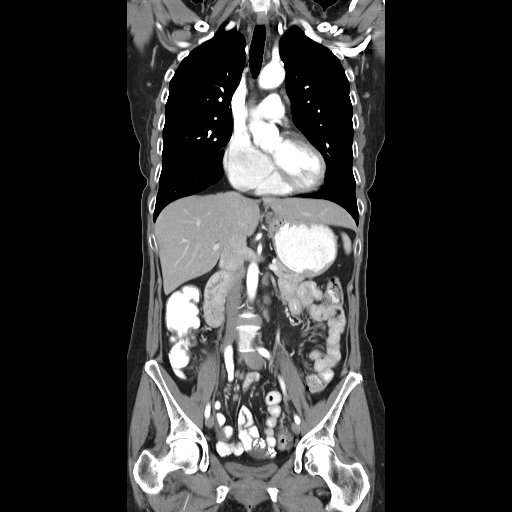
[im 44/80  soft-tissue]
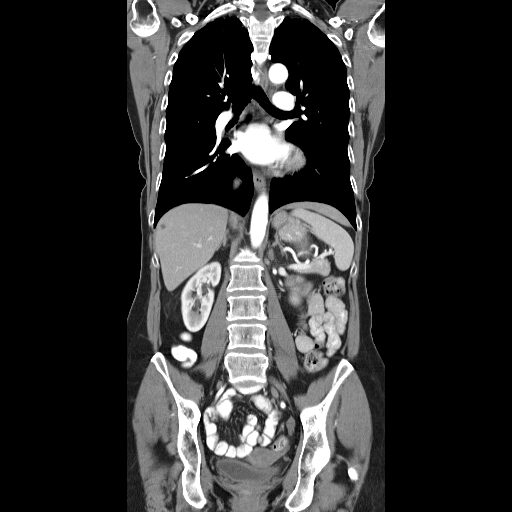

[16 of 46 positions shown; findings below may reference images not displayed]

FINDINGS: CT CHEST FINDINGS

There is no pleural effusion identified. No airspace consolidation
or pulmonary mass identified. No suspicious pulmonary nodule
identified. The heart size appears normal.

Trachea is patent and is midline. The heart size is normal. No
pericardial effusion. Lower right paratracheal lymph node measures 8
mm, image 24/ series 2. Previously 1.1 cm. 5 mm pre-vascular lymph
node is identified, image 24/series 2. Previously 6 mm.

Aberrant right subclavian artery is noted which courses posterior to
the esophagus. No enlarged axillary lymph nodes. There is no
supraclavicular adenopathy identified.

Review of the visualized osseous structures is significant for mild
degenerative disc disease within the thoracic spine. No aggressive
lytic or sclerotic bone lesions identified.

CT ABDOMEN AND PELVIS FINDINGS

Low attenuation structure in the right hepatic lobe is stable
measuring 7 mm. This is favored to represent a benign cyst. No
additional liver abnormalities noted. Gallbladder is normal. No
biliary dilatation. Normal appearance of the pancreas. The spleen is
unremarkable.

The adrenal glands are both normal. The right kidney appears normal.
The left kidney is also normal. Urinary bladder is on unremarkable.
The uterus contains several subserosal fibroids which appear
calcified. Adnexal structures are unremarkable.

No free fluid or abnormal fluid collection within the abdomen or
pelvis. The abdominal aorta has a normal caliber. There is no
aneurysm.

The stomach appears normal. The small bowel loops are also
unremarkable. Normal appearance of the colon.

Small bowel mesenteric and retroperitoneal adenopathy is again
noted. Index periaortic lymph node measures 9 mm, image 73/ series
2. This is unchanged from previous exam. Small bowel mesenteric
lymph node stress set jejunal mesenteric lymph node measures 0.8 cm,
image 71/ series 2. This is unchanged from previous exam. Index soft
tissue infiltration within the jejunal mesenteric measures 2.9 x
cm, image 80/ series 2. This is compared with 2.8 x 1.0 cm
previously. Stable appearance of right lower quadrant ileocolic
lymph node, image 90/series 2. No new or progressive adenopathy
identified.

Review of the visualized bony structures is significant for mild
degenerative disc disease. No aggressive lytic or sclerotic bone
lesions.
IMPRESSION: CT CHEST IMPRESSION

1. Stable CT of the chest.  No evidence for mass or adenopathy.

CT ABDOMEN AND PELVIS IMPRESSION

1. Stable appearance of abnormal lymph nodes and soft tissue within
the central mesenteric and retroperitoneal space.

2. No new or progressive findings noted in the abdomen or pelvis

## 2014-10-18 ENCOUNTER — Ambulatory Visit (INDEPENDENT_AMBULATORY_CARE_PROVIDER_SITE_OTHER): Payer: BLUE CROSS/BLUE SHIELD

## 2014-10-18 ENCOUNTER — Encounter: Payer: Self-pay | Admitting: Podiatry

## 2014-10-18 ENCOUNTER — Ambulatory Visit (INDEPENDENT_AMBULATORY_CARE_PROVIDER_SITE_OTHER): Payer: BLUE CROSS/BLUE SHIELD | Admitting: Podiatry

## 2014-10-18 VITALS — BP 107/74 | HR 79 | Resp 12

## 2014-10-18 DIAGNOSIS — M779 Enthesopathy, unspecified: Secondary | ICD-10-CM

## 2014-10-18 DIAGNOSIS — S93401A Sprain of unspecified ligament of right ankle, initial encounter: Secondary | ICD-10-CM

## 2014-10-18 NOTE — Progress Notes (Signed)
   Subjective:    Patient ID: Judith Green, female    DOB: 1951/05/03, 64 y.o.   MRN: [id number]  HPI  PT STATED INJURED THE RT FOOT THIS MORNING AND STILL PAINFUL. THE FOOT STILL THE SAME BUT WHEN WALKING OR FLEXING IS VERY PAINFUL. TRIED NO TREATMENT.  Review of Systems  Musculoskeletal: Positive for gait problem.  All other systems reviewed and are negative.      Objective:   Physical Exam        Assessment & Plan:

## 2014-10-18 NOTE — Progress Notes (Signed)
Subjective:     Patient ID: Judith Green, female   DOB: 1951/03/23, 64 y.o.   MRN: [id number]  HPI patient states I fell and developed a whole turned my right ankle and it's been very sore on the side today. I'm worried I broke some   Review of Systems     Objective:   Physical Exam Neurovascular status is intact with muscle strength adequate range of motion within normal limits. Patient's noted to have a area of swelling around the lateral ankle right anterior talofibular area along with moderate discomfort at the base of the fifth metatarsal right foot    Assessment:     Sprained ankle right with possibility for fracture    Plan:     H&P and x-rays reviewed with patient. I recommended that she utilize anklet compression treatment ice therapy and I do not currently see signs of fracture which I discussed with patient the point if symptoms persist in the next few weeks

## 2015-07-04 ENCOUNTER — Other Ambulatory Visit: Payer: Self-pay | Admitting: Gastroenterology

## 2015-10-14 ENCOUNTER — Other Ambulatory Visit: Payer: Self-pay | Admitting: Internal Medicine

## 2015-10-14 ENCOUNTER — Other Ambulatory Visit (HOSPITAL_COMMUNITY)
Admission: RE | Admit: 2015-10-14 | Discharge: 2015-10-14 | Disposition: A | Discharge status: Home / Self Care | Payer: BLUE CROSS/BLUE SHIELD | Source: Ambulatory Visit | Attending: Internal Medicine | Admitting: Internal Medicine

## 2015-10-14 DIAGNOSIS — Z01419 Encounter for gynecological examination (general) (routine) without abnormal findings: Secondary | ICD-10-CM | POA: Diagnosis present

## 2015-10-16 LAB — CYTOLOGY - PAP

## 2016-11-02 DIAGNOSIS — I450 Right fascicular block: Secondary | ICD-10-CM | POA: Diagnosis not present

## 2016-11-02 DIAGNOSIS — I10 Essential (primary) hypertension: Secondary | ICD-10-CM | POA: Diagnosis not present

## 2016-11-02 DIAGNOSIS — Z0000 Encounter for general adult medical examination without abnormal findings: Secondary | ICD-10-CM | POA: Diagnosis not present

## 2016-11-02 DIAGNOSIS — Z780 Asymptomatic menopausal state: Secondary | ICD-10-CM | POA: Diagnosis not present

## 2016-11-06 DIAGNOSIS — L505 Cholinergic urticaria: Secondary | ICD-10-CM | POA: Diagnosis not present

## 2016-11-06 DIAGNOSIS — I450 Right fascicular block: Secondary | ICD-10-CM | POA: Diagnosis not present

## 2016-11-06 DIAGNOSIS — I10 Essential (primary) hypertension: Secondary | ICD-10-CM | POA: Diagnosis not present

## 2016-11-06 DIAGNOSIS — T63441A Toxic effect of venom of bees, accidental (unintentional), initial encounter: Secondary | ICD-10-CM | POA: Diagnosis not present

## 2016-11-12 DIAGNOSIS — Z23 Encounter for immunization: Secondary | ICD-10-CM | POA: Diagnosis not present

## 2017-11-03 DIAGNOSIS — Z0000 Encounter for general adult medical examination without abnormal findings: Secondary | ICD-10-CM | POA: Diagnosis not present

## 2017-11-03 DIAGNOSIS — I450 Right fascicular block: Secondary | ICD-10-CM | POA: Diagnosis not present

## 2017-11-03 DIAGNOSIS — L505 Cholinergic urticaria: Secondary | ICD-10-CM | POA: Diagnosis not present

## 2017-11-03 DIAGNOSIS — I10 Essential (primary) hypertension: Secondary | ICD-10-CM | POA: Diagnosis not present

## 2017-11-10 ENCOUNTER — Other Ambulatory Visit: Payer: Self-pay | Admitting: Internal Medicine

## 2017-11-10 DIAGNOSIS — L505 Cholinergic urticaria: Secondary | ICD-10-CM | POA: Diagnosis not present

## 2017-11-10 DIAGNOSIS — I10 Essential (primary) hypertension: Secondary | ICD-10-CM | POA: Diagnosis not present

## 2017-11-10 DIAGNOSIS — I450 Right fascicular block: Secondary | ICD-10-CM | POA: Diagnosis not present

## 2017-11-10 DIAGNOSIS — Z1239 Encounter for other screening for malignant neoplasm of breast: Secondary | ICD-10-CM

## 2017-11-12 DIAGNOSIS — B029 Zoster without complications: Secondary | ICD-10-CM | POA: Diagnosis not present

## 2017-11-12 DIAGNOSIS — H9209 Otalgia, unspecified ear: Secondary | ICD-10-CM | POA: Diagnosis not present

## 2017-11-16 ENCOUNTER — Ambulatory Visit
Admission: RE | Admit: 2017-11-16 | Discharge: 2017-11-16 | Disposition: A | Discharge status: Home / Self Care | Payer: Medicare Other | Source: Ambulatory Visit | Attending: Internal Medicine | Admitting: Internal Medicine

## 2017-11-16 DIAGNOSIS — Z1239 Encounter for other screening for malignant neoplasm of breast: Secondary | ICD-10-CM

## 2017-11-16 DIAGNOSIS — Z1231 Encounter for screening mammogram for malignant neoplasm of breast: Secondary | ICD-10-CM | POA: Diagnosis not present

## 2017-11-17 ENCOUNTER — Other Ambulatory Visit: Payer: Self-pay | Admitting: Internal Medicine

## 2017-11-17 DIAGNOSIS — R921 Mammographic calcification found on diagnostic imaging of breast: Secondary | ICD-10-CM

## 2017-11-19 ENCOUNTER — Other Ambulatory Visit: Payer: Self-pay | Admitting: Internal Medicine

## 2017-11-19 ENCOUNTER — Ambulatory Visit
Admission: RE | Admit: 2017-11-19 | Discharge: 2017-11-19 | Disposition: A | Discharge status: Home / Self Care | Payer: Medicare Other | Source: Ambulatory Visit | Attending: Internal Medicine | Admitting: Internal Medicine

## 2017-11-19 DIAGNOSIS — R921 Mammographic calcification found on diagnostic imaging of breast: Secondary | ICD-10-CM | POA: Diagnosis not present

## 2018-03-22 DIAGNOSIS — J45909 Unspecified asthma, uncomplicated: Secondary | ICD-10-CM | POA: Diagnosis not present

## 2018-03-22 DIAGNOSIS — J209 Acute bronchitis, unspecified: Secondary | ICD-10-CM | POA: Diagnosis not present

## 2018-05-05 DIAGNOSIS — Z23 Encounter for immunization: Secondary | ICD-10-CM | POA: Diagnosis not present

## 2018-11-15 DIAGNOSIS — I450 Right fascicular block: Secondary | ICD-10-CM | POA: Diagnosis not present

## 2018-11-15 DIAGNOSIS — I10 Essential (primary) hypertension: Secondary | ICD-10-CM | POA: Diagnosis not present

## 2018-11-15 DIAGNOSIS — Z0000 Encounter for general adult medical examination without abnormal findings: Secondary | ICD-10-CM | POA: Diagnosis not present

## 2018-11-15 DIAGNOSIS — L505 Cholinergic urticaria: Secondary | ICD-10-CM | POA: Diagnosis not present

## 2018-11-18 DIAGNOSIS — I10 Essential (primary) hypertension: Secondary | ICD-10-CM | POA: Diagnosis not present

## 2018-11-18 DIAGNOSIS — M810 Age-related osteoporosis without current pathological fracture: Secondary | ICD-10-CM | POA: Diagnosis not present

## 2018-11-18 DIAGNOSIS — Z7189 Other specified counseling: Secondary | ICD-10-CM | POA: Diagnosis not present

## 2018-11-18 DIAGNOSIS — J45909 Unspecified asthma, uncomplicated: Secondary | ICD-10-CM | POA: Diagnosis not present

## 2018-11-18 DIAGNOSIS — Z23 Encounter for immunization: Secondary | ICD-10-CM | POA: Diagnosis not present

## 2018-11-18 DIAGNOSIS — Z780 Asymptomatic menopausal state: Secondary | ICD-10-CM | POA: Diagnosis not present

## 2018-11-18 DIAGNOSIS — L505 Cholinergic urticaria: Secondary | ICD-10-CM | POA: Diagnosis not present

## 2019-01-11 ENCOUNTER — Other Ambulatory Visit: Payer: Self-pay

## 2019-01-20 DIAGNOSIS — H5213 Myopia, bilateral: Secondary | ICD-10-CM | POA: Diagnosis not present

## 2019-01-20 DIAGNOSIS — H4720 Unspecified optic atrophy: Secondary | ICD-10-CM | POA: Diagnosis not present

## 2019-01-20 DIAGNOSIS — H53001 Unspecified amblyopia, right eye: Secondary | ICD-10-CM | POA: Diagnosis not present

## 2019-01-20 DIAGNOSIS — H2513 Age-related nuclear cataract, bilateral: Secondary | ICD-10-CM | POA: Diagnosis not present

## 2019-03-25 DIAGNOSIS — Z23 Encounter for immunization: Secondary | ICD-10-CM | POA: Diagnosis not present

## 2019-08-30 DIAGNOSIS — H1031 Unspecified acute conjunctivitis, right eye: Secondary | ICD-10-CM | POA: Diagnosis not present

## 2019-09-06 DIAGNOSIS — H1031 Unspecified acute conjunctivitis, right eye: Secondary | ICD-10-CM | POA: Diagnosis not present

## 2020-02-10 DIAGNOSIS — Z23 Encounter for immunization: Secondary | ICD-10-CM | POA: Diagnosis not present

## 2020-05-12 DIAGNOSIS — Z23 Encounter for immunization: Secondary | ICD-10-CM | POA: Diagnosis not present

## 2020-12-17 DIAGNOSIS — I10 Essential (primary) hypertension: Secondary | ICD-10-CM | POA: Diagnosis not present

## 2020-12-17 DIAGNOSIS — Z131 Encounter for screening for diabetes mellitus: Secondary | ICD-10-CM | POA: Diagnosis not present

## 2020-12-17 DIAGNOSIS — R5383 Other fatigue: Secondary | ICD-10-CM | POA: Diagnosis not present

## 2020-12-17 DIAGNOSIS — Z0000 Encounter for general adult medical examination without abnormal findings: Secondary | ICD-10-CM | POA: Diagnosis not present

## 2020-12-24 DIAGNOSIS — L505 Cholinergic urticaria: Secondary | ICD-10-CM | POA: Diagnosis not present

## 2020-12-24 DIAGNOSIS — M810 Age-related osteoporosis without current pathological fracture: Secondary | ICD-10-CM | POA: Diagnosis not present

## 2020-12-24 DIAGNOSIS — I10 Essential (primary) hypertension: Secondary | ICD-10-CM | POA: Diagnosis not present

## 2020-12-24 DIAGNOSIS — E876 Hypokalemia: Secondary | ICD-10-CM | POA: Diagnosis not present

## 2020-12-24 DIAGNOSIS — J45909 Unspecified asthma, uncomplicated: Secondary | ICD-10-CM | POA: Diagnosis not present

## 2021-02-25 DIAGNOSIS — I10 Essential (primary) hypertension: Secondary | ICD-10-CM | POA: Diagnosis not present

## 2021-03-04 DIAGNOSIS — Z23 Encounter for immunization: Secondary | ICD-10-CM | POA: Diagnosis not present

## 2021-03-04 DIAGNOSIS — E876 Hypokalemia: Secondary | ICD-10-CM | POA: Diagnosis not present

## 2021-03-04 DIAGNOSIS — I10 Essential (primary) hypertension: Secondary | ICD-10-CM | POA: Diagnosis not present

## 2021-03-12 DIAGNOSIS — E876 Hypokalemia: Secondary | ICD-10-CM | POA: Diagnosis not present

## 2021-03-12 DIAGNOSIS — I10 Essential (primary) hypertension: Secondary | ICD-10-CM | POA: Diagnosis not present

## 2021-03-26 DIAGNOSIS — E876 Hypokalemia: Secondary | ICD-10-CM | POA: Diagnosis not present

## 2021-03-26 DIAGNOSIS — I10 Essential (primary) hypertension: Secondary | ICD-10-CM | POA: Diagnosis not present

## 2021-04-02 DIAGNOSIS — I10 Essential (primary) hypertension: Secondary | ICD-10-CM | POA: Diagnosis not present

## 2021-04-02 DIAGNOSIS — E876 Hypokalemia: Secondary | ICD-10-CM | POA: Diagnosis not present

## 2021-09-03 DIAGNOSIS — I10 Essential (primary) hypertension: Secondary | ICD-10-CM | POA: Diagnosis not present

## 2021-09-03 DIAGNOSIS — E876 Hypokalemia: Secondary | ICD-10-CM | POA: Diagnosis not present

## 2021-09-10 DIAGNOSIS — E782 Mixed hyperlipidemia: Secondary | ICD-10-CM | POA: Diagnosis not present

## 2021-09-10 DIAGNOSIS — I10 Essential (primary) hypertension: Secondary | ICD-10-CM | POA: Diagnosis not present

## 2022-01-28 DIAGNOSIS — R5383 Other fatigue: Secondary | ICD-10-CM | POA: Diagnosis not present

## 2022-01-28 DIAGNOSIS — Z0000 Encounter for general adult medical examination without abnormal findings: Secondary | ICD-10-CM | POA: Diagnosis not present

## 2022-01-28 DIAGNOSIS — E782 Mixed hyperlipidemia: Secondary | ICD-10-CM | POA: Diagnosis not present

## 2022-01-28 DIAGNOSIS — I10 Essential (primary) hypertension: Secondary | ICD-10-CM | POA: Diagnosis not present

## 2022-02-04 DIAGNOSIS — R600 Localized edema: Secondary | ICD-10-CM | POA: Diagnosis not present

## 2022-02-04 DIAGNOSIS — M25461 Effusion, right knee: Secondary | ICD-10-CM | POA: Diagnosis not present

## 2022-02-04 DIAGNOSIS — I10 Essential (primary) hypertension: Secondary | ICD-10-CM | POA: Diagnosis not present

## 2022-02-04 DIAGNOSIS — E782 Mixed hyperlipidemia: Secondary | ICD-10-CM | POA: Diagnosis not present

## 2022-02-05 DIAGNOSIS — M25569 Pain in unspecified knee: Secondary | ICD-10-CM | POA: Diagnosis not present

## 2022-02-05 DIAGNOSIS — M25461 Effusion, right knee: Secondary | ICD-10-CM | POA: Diagnosis not present

## 2022-02-05 DIAGNOSIS — M7051 Other bursitis of knee, right knee: Secondary | ICD-10-CM | POA: Diagnosis not present

## 2022-02-05 DIAGNOSIS — M25561 Pain in right knee: Secondary | ICD-10-CM | POA: Diagnosis not present

## 2022-02-23 DIAGNOSIS — E782 Mixed hyperlipidemia: Secondary | ICD-10-CM | POA: Diagnosis not present

## 2022-02-23 DIAGNOSIS — I10 Essential (primary) hypertension: Secondary | ICD-10-CM | POA: Diagnosis not present

## 2022-02-23 DIAGNOSIS — M25461 Effusion, right knee: Secondary | ICD-10-CM | POA: Diagnosis not present

## 2022-02-23 DIAGNOSIS — R600 Localized edema: Secondary | ICD-10-CM | POA: Diagnosis not present

## 2022-02-23 DIAGNOSIS — M25569 Pain in unspecified knee: Secondary | ICD-10-CM | POA: Diagnosis not present

## 2022-02-26 DIAGNOSIS — R600 Localized edema: Secondary | ICD-10-CM | POA: Diagnosis not present

## 2022-02-26 DIAGNOSIS — R0600 Dyspnea, unspecified: Secondary | ICD-10-CM | POA: Diagnosis not present

## 2022-03-02 DIAGNOSIS — E782 Mixed hyperlipidemia: Secondary | ICD-10-CM | POA: Diagnosis not present

## 2022-03-02 DIAGNOSIS — M25461 Effusion, right knee: Secondary | ICD-10-CM | POA: Diagnosis not present

## 2022-03-02 DIAGNOSIS — I10 Essential (primary) hypertension: Secondary | ICD-10-CM | POA: Diagnosis not present

## 2022-03-02 DIAGNOSIS — R600 Localized edema: Secondary | ICD-10-CM | POA: Diagnosis not present

## 2022-07-03 DIAGNOSIS — R600 Localized edema: Secondary | ICD-10-CM | POA: Diagnosis not present

## 2022-07-03 DIAGNOSIS — E782 Mixed hyperlipidemia: Secondary | ICD-10-CM | POA: Diagnosis not present

## 2022-07-03 DIAGNOSIS — I10 Essential (primary) hypertension: Secondary | ICD-10-CM | POA: Diagnosis not present

## 2022-07-03 DIAGNOSIS — M25461 Effusion, right knee: Secondary | ICD-10-CM | POA: Diagnosis not present

## 2022-07-10 DIAGNOSIS — E782 Mixed hyperlipidemia: Secondary | ICD-10-CM | POA: Diagnosis not present

## 2022-07-10 DIAGNOSIS — R600 Localized edema: Secondary | ICD-10-CM | POA: Diagnosis not present

## 2022-07-10 DIAGNOSIS — I10 Essential (primary) hypertension: Secondary | ICD-10-CM | POA: Diagnosis not present

## 2022-07-10 DIAGNOSIS — N182 Chronic kidney disease, stage 2 (mild): Secondary | ICD-10-CM | POA: Diagnosis not present

## 2022-07-22 DIAGNOSIS — H5213 Myopia, bilateral: Secondary | ICD-10-CM | POA: Diagnosis not present

## 2022-07-22 DIAGNOSIS — H2513 Age-related nuclear cataract, bilateral: Secondary | ICD-10-CM | POA: Diagnosis not present

## 2022-07-22 DIAGNOSIS — H524 Presbyopia: Secondary | ICD-10-CM | POA: Diagnosis not present

## 2022-07-22 DIAGNOSIS — H4720 Unspecified optic atrophy: Secondary | ICD-10-CM | POA: Diagnosis not present

## 2023-01-20 DIAGNOSIS — H40013 Open angle with borderline findings, low risk, bilateral: Secondary | ICD-10-CM | POA: Diagnosis not present

## 2023-02-12 DIAGNOSIS — R600 Localized edema: Secondary | ICD-10-CM | POA: Diagnosis not present

## 2023-02-12 DIAGNOSIS — E782 Mixed hyperlipidemia: Secondary | ICD-10-CM | POA: Diagnosis not present

## 2023-02-12 DIAGNOSIS — Z23 Encounter for immunization: Secondary | ICD-10-CM | POA: Diagnosis not present

## 2023-02-12 DIAGNOSIS — R5383 Other fatigue: Secondary | ICD-10-CM | POA: Diagnosis not present

## 2023-02-12 DIAGNOSIS — Z0000 Encounter for general adult medical examination without abnormal findings: Secondary | ICD-10-CM | POA: Diagnosis not present

## 2023-02-12 DIAGNOSIS — I10 Essential (primary) hypertension: Secondary | ICD-10-CM | POA: Diagnosis not present

## 2023-02-12 DIAGNOSIS — N182 Chronic kidney disease, stage 2 (mild): Secondary | ICD-10-CM | POA: Diagnosis not present

## 2023-02-19 DIAGNOSIS — R600 Localized edema: Secondary | ICD-10-CM | POA: Diagnosis not present

## 2023-02-19 DIAGNOSIS — M810 Age-related osteoporosis without current pathological fracture: Secondary | ICD-10-CM | POA: Diagnosis not present

## 2023-02-19 DIAGNOSIS — I10 Essential (primary) hypertension: Secondary | ICD-10-CM | POA: Diagnosis not present

## 2023-02-19 DIAGNOSIS — E782 Mixed hyperlipidemia: Secondary | ICD-10-CM | POA: Diagnosis not present

## 2023-03-26 ENCOUNTER — Emergency Department: Payer: Medicare Other

## 2023-03-26 ENCOUNTER — Emergency Department
Admission: EM | Admit: 2023-03-26 | Discharge: 2023-03-26 | Disposition: A | Discharge status: Home / Self Care | Payer: Medicare Other | Attending: Emergency Medicine | Admitting: Emergency Medicine

## 2023-03-26 ENCOUNTER — Other Ambulatory Visit: Payer: Self-pay

## 2023-03-26 DIAGNOSIS — M545 Low back pain, unspecified: Secondary | ICD-10-CM | POA: Insufficient documentation

## 2023-03-26 DIAGNOSIS — S53104A Unspecified dislocation of right ulnohumeral joint, initial encounter: Secondary | ICD-10-CM | POA: Diagnosis not present

## 2023-03-26 DIAGNOSIS — S52611A Displaced fracture of right ulna styloid process, initial encounter for closed fracture: Secondary | ICD-10-CM | POA: Diagnosis not present

## 2023-03-26 DIAGNOSIS — S52351A Displaced comminuted fracture of shaft of radius, right arm, initial encounter for closed fracture: Secondary | ICD-10-CM | POA: Diagnosis not present

## 2023-03-26 DIAGNOSIS — G8929 Other chronic pain: Secondary | ICD-10-CM | POA: Diagnosis not present

## 2023-03-26 DIAGNOSIS — W19XXXA Unspecified fall, initial encounter: Secondary | ICD-10-CM | POA: Diagnosis not present

## 2023-03-26 DIAGNOSIS — S59901A Unspecified injury of right elbow, initial encounter: Secondary | ICD-10-CM | POA: Diagnosis present

## 2023-03-26 DIAGNOSIS — S52501A Unspecified fracture of the lower end of right radius, initial encounter for closed fracture: Secondary | ICD-10-CM | POA: Diagnosis not present

## 2023-03-26 DIAGNOSIS — S62101A Fracture of unspecified carpal bone, right wrist, initial encounter for closed fracture: Secondary | ICD-10-CM

## 2023-03-26 DIAGNOSIS — M47816 Spondylosis without myelopathy or radiculopathy, lumbar region: Secondary | ICD-10-CM | POA: Diagnosis not present

## 2023-03-26 DIAGNOSIS — D259 Leiomyoma of uterus, unspecified: Secondary | ICD-10-CM | POA: Diagnosis not present

## 2023-03-26 DIAGNOSIS — S52571A Other intraarticular fracture of lower end of right radius, initial encounter for closed fracture: Secondary | ICD-10-CM | POA: Diagnosis not present

## 2023-03-26 DIAGNOSIS — M25521 Pain in right elbow: Secondary | ICD-10-CM | POA: Diagnosis not present

## 2023-03-26 DIAGNOSIS — M25551 Pain in right hip: Secondary | ICD-10-CM | POA: Diagnosis not present

## 2023-03-26 LAB — BASIC METABOLIC PANEL
Anion gap: 9 (ref 5–15)
BUN: 18 mg/dL (ref 8–23)
CO2: 24 mmol/L (ref 22–32)
Calcium: 8.8 mg/dL — ABNORMAL LOW (ref 8.9–10.3)
Chloride: 104 mmol/L (ref 98–111)
Creatinine, Ser: 0.96 mg/dL (ref 0.44–1.00)
GFR, Estimated: >60 mL/min (ref >60)
Glucose, Bld: 123 mg/dL — ABNORMAL HIGH (ref 70–99)
Potassium: 3 mmol/L — ABNORMAL LOW (ref 3.5–5.1)
Sodium: 137 mmol/L (ref 135–145)

## 2023-03-26 LAB — CBC
HCT: 42.4 % (ref 36.0–46.0)
Hemoglobin: 13.7 g/dL (ref 12.0–15.0)
MCH: 27.6 pg (ref 26.0–34.0)
MCHC: 32.3 g/dL (ref 30.0–36.0)
MCV: 85.3 fL (ref 80.0–100.0)
Platelets: 191 10*3/uL (ref 150–400)
RBC: 4.97 MIL/uL (ref 3.87–5.11)
RDW: 13.4 % (ref 11.5–15.5)
WBC: 14.1 10*3/uL — ABNORMAL HIGH (ref 4.0–10.5)
nRBC: 0 % (ref 0.0–0.2)

## 2023-03-26 MED ORDER — PROPOFOL 10 MG/ML IV BOLUS
0.5000 mg/kg | Freq: Once | INTRAVENOUS | Status: AC
  Administered 2023-03-26: 30.4 mg via INTRAVENOUS
  Filled 2023-03-26: qty 20

## 2023-03-26 MED ORDER — FENTANYL CITRATE PF 50 MCG/ML IJ SOSY
50.0000 ug | PREFILLED_SYRINGE | Freq: Once | INTRAMUSCULAR | Status: AC
  Administered 2023-03-26: 50 ug via INTRAVENOUS
  Filled 2023-03-26: qty 1

## 2023-03-26 MED ORDER — ONDANSETRON HCL 4 MG/2ML IJ SOLN
4.0000 mg | Freq: Once | INTRAMUSCULAR | Status: AC
  Administered 2023-03-26: 4 mg via INTRAVENOUS
  Filled 2023-03-26: qty 2

## 2023-03-26 MED ORDER — FENTANYL CITRATE PF 50 MCG/ML IJ SOSY
100.0000 ug | PREFILLED_SYRINGE | Freq: Once | INTRAMUSCULAR | Status: AC
  Administered 2023-03-26: 100 ug via INTRAVENOUS
  Filled 2023-03-26: qty 2

## 2023-03-26 MED ORDER — OXYCODONE HCL 5 MG PO TABS
5.0000 mg | ORAL_TABLET | Freq: Once | ORAL | Status: AC
  Administered 2023-03-26: 5 mg via ORAL
  Filled 2023-03-26: qty 1

## 2023-03-26 MED ORDER — OXYCODONE HCL 5 MG PO TABS
5.0000 mg | ORAL_TABLET | Freq: Three times a day (TID) | ORAL | 0 refills | Status: AC | PRN
Start: 2023-03-26 — End: 2024-03-25

## 2023-03-26 NOTE — ED Provider Notes (Signed)
[facility] Provider Note    Event Date/Time   First MD Initiated Contact with Patient 03/26/23 2104     (approximate)   History   Fall   HPI  Judith Green is a 72 y.o. female who presents to the emergency department today with primary concerns for right arm pain after a mechanical fall.  Patient tripped over a small display case sounding object.  She landed onto her right side.  Having pain primarily in her right elbow and wrist.  The patient denies hitting her head.  Also has some right lower back pain.     Physical Exam   Triage Vital Signs: ED Triage Vitals  Encounter Vitals Group     BP 03/26/23 1727 126/86     Systolic BP Percentile --      Diastolic BP Percentile --      Pulse Rate 03/26/23 1727 83     Resp 03/26/23 1727 16     Temp 03/26/23 1727 98.2 F (36.8 C)     Temp src --      SpO2 03/26/23 1727 97 %     Weight 03/26/23 1722 134 lb (60.8 kg)     Height 03/26/23 1722 5\' 3"  (1.6 m)     Head Circumference --      Peak Flow --      Pain Score 03/26/23 1721 6     Pain Loc --      Pain Education --      Exclude from Growth Chart --     Most recent vital signs: Vitals:   03/26/23 1727  BP: 126/86  Pulse: 83  Resp: 16  Temp: 98.2 F (36.8 C)  SpO2: 97%   General: Awake, alert, oriented. CV:  Good peripheral perfusion. Regular rate and rhythm. Resp:  Normal effort. Lungs clear. Abd:  No distention.  Other:  Right wrist with slight swelling, right elbow with deformity. NV intact.   ED Results / Procedures / Treatments   Labs (all labs ordered are listed, but only abnormal results are displayed) Labs Reviewed  CBC - Abnormal; Notable for the following components:      Result Value   WBC 14.1 (*)    All other components within normal limits  BASIC METABOLIC PANEL - Abnormal; Notable for the following components:   Potassium 3.0 (*)    Glucose, Bld 123 (*)    Calcium 8.8 (*)    All other components within normal  limits     EKG  None   RADIOLOGY I independently interpreted and visualized the right wrist. My interpretation: distal radial and ulnar fracture Radiology interpretation:  IMPRESSION:  1. Acute comminuted intra-articular distal right radius fracture.  2. Osseous fragments about the anterior elbow suspicious for  coronary process fracture. Dedicated elbow radiographs are  recommended.  3. Mildly displaced ulnar styloid fracture.    I independently interpreted and visualized the right hip. My interpretation: No fracutre Radiology interpretation:  IMPRESSION:  No acute fracture or dislocation.     PROCEDURES:  Critical Care performed: No  .Sedation  Date/Time: 03/26/2023 10:48 PM  Performed by: Phineas Semen, MD Authorized by: Phineas Semen, MD   Consent:    Consent obtained:  Written   Consent given by:  Patient   Risks discussed:  Respiratory compromise necessitating ventilatory assistance and intubation Universal protocol:    Immediately prior to procedure, a time out was called: yes   Pre-sedation assessment:    Time since  last food or drink:  N/a   ASA classification: class 1 - normal, healthy patient     Mallampati score:  I - soft palate, uvula, fauces, pillars visible   Pre-sedation assessments completed and reviewed: airway patency, cardiovascular function, nausea/vomiting, pain level and respiratory function   Procedure details (see MAR for exact dosages):    Total Provider sedation time (minutes):  15   Reduction of dislocation Performed by: Phineas Semen Authorized by: Phineas Semen Consent: written consent obtained. Risks and benefits: risks, benefits and alternatives were discussed Consent given by: patient Required items: required blood products, implants, devices, and special equipment available Time out: Immediately prior to procedure a "time out" was called to verify the correct patient, procedure, equipment, support staff and  site/side marked as required.  Patient sedated: propofol  Vitals: Vital signs were monitored during sedation. Patient tolerance: Patient tolerated the procedure well with no immediate complications. Joint: right elbow Reduction technique: traction/counter traction       MEDICATIONS ORDERED IN ED: Medications  fentaNYL (SUBLIMAZE) injection 50 mcg (50 mcg Intravenous Given 03/26/23 1946)  ondansetron (ZOFRAN) injection 4 mg (4 mg Intravenous Given 03/26/23 1946)     IMPRESSION / MDM / ASSESSMENT AND PLAN / ED COURSE  I reviewed the triage vital signs and the nursing notes.                              Differential diagnosis includes, but is not limited to, fracture/dislocation  Patient's presentation is most consistent with acute presentation with potential threat to life or bodily function.   The patient is on the cardiac monitor to evaluate for evidence of arrhythmia and/or significant heart rate changes.  Patient presented to the emergency department today after mechanical fall and complaining primarily of right arm pain.  X-rays show fracture of right wrist and dislocation of right elbow.  Discussed findings with patient.  Consented patient for sedation for reduction.  Patient tolerated sedation well.  Post reduction films with improved anatomical alignment.  Patient was placed in a splint and sling.  Discussed with patient importance of orthopedic follow-up.      FINAL CLINICAL IMPRESSION(S) / ED DIAGNOSES   Final diagnoses:  Fall, initial encounter  Closed fracture of right wrist, initial encounter  Closed dislocation of right elbow, initial encounter    Note:  This document was prepared using Dragon voice recognition software and may include unintentional dictation errors.    Phineas Semen, MD 03/26/23 2300

## 2023-03-26 NOTE — ED Triage Notes (Signed)
Pt to ED [facility] for slip and trip today. Denies hitting head or LOC. C/o pain to right wrist and forearm, right hip. Deformity noted to right wrist per ems, in splint on arrival. 150 mcg fentanyl PTA, 20g IV to left AC.

## 2023-03-26 NOTE — ED Notes (Signed)
Pt complaint of 10/10 pain and nausea.

## 2023-03-26 NOTE — Discharge Instructions (Addendum)
Please be sure to follow up with orthopedic surgery.  

## 2023-04-02 DIAGNOSIS — M24321 Pathological dislocation of right elbow, not elsewhere classified: Secondary | ICD-10-CM | POA: Diagnosis not present

## 2023-04-02 DIAGNOSIS — M25531 Pain in right wrist: Secondary | ICD-10-CM | POA: Diagnosis not present

## 2023-04-02 DIAGNOSIS — M25521 Pain in right elbow: Secondary | ICD-10-CM | POA: Diagnosis not present

## 2023-04-02 DIAGNOSIS — S52531A Colles' fracture of right radius, initial encounter for closed fracture: Secondary | ICD-10-CM | POA: Diagnosis not present

## 2023-04-02 DIAGNOSIS — S52091A Other fracture of upper end of right ulna, initial encounter for closed fracture: Secondary | ICD-10-CM | POA: Diagnosis not present

## 2023-04-02 DIAGNOSIS — G8929 Other chronic pain: Secondary | ICD-10-CM | POA: Diagnosis not present

## 2023-04-02 DIAGNOSIS — S52611A Displaced fracture of right ulna styloid process, initial encounter for closed fracture: Secondary | ICD-10-CM | POA: Diagnosis not present

## 2023-04-27 DIAGNOSIS — G8929 Other chronic pain: Secondary | ICD-10-CM | POA: Diagnosis not present

## 2023-04-27 DIAGNOSIS — S52091A Other fracture of upper end of right ulna, initial encounter for closed fracture: Secondary | ICD-10-CM | POA: Diagnosis not present

## 2023-04-27 DIAGNOSIS — S52611A Displaced fracture of right ulna styloid process, initial encounter for closed fracture: Secondary | ICD-10-CM | POA: Diagnosis not present

## 2023-04-27 DIAGNOSIS — M25531 Pain in right wrist: Secondary | ICD-10-CM | POA: Diagnosis not present

## 2023-04-27 DIAGNOSIS — M25521 Pain in right elbow: Secondary | ICD-10-CM | POA: Diagnosis not present

## 2023-04-27 DIAGNOSIS — S52531A Colles' fracture of right radius, initial encounter for closed fracture: Secondary | ICD-10-CM | POA: Diagnosis not present

## 2023-05-04 DIAGNOSIS — M25631 Stiffness of right wrist, not elsewhere classified: Secondary | ICD-10-CM | POA: Diagnosis not present

## 2023-05-04 DIAGNOSIS — R531 Weakness: Secondary | ICD-10-CM | POA: Diagnosis not present

## 2023-05-04 DIAGNOSIS — M25521 Pain in right elbow: Secondary | ICD-10-CM | POA: Diagnosis not present

## 2023-05-04 DIAGNOSIS — M25621 Stiffness of right elbow, not elsewhere classified: Secondary | ICD-10-CM | POA: Diagnosis not present

## 2023-05-10 DIAGNOSIS — M25521 Pain in right elbow: Secondary | ICD-10-CM | POA: Diagnosis not present

## 2023-05-18 DIAGNOSIS — M25631 Stiffness of right wrist, not elsewhere classified: Secondary | ICD-10-CM | POA: Diagnosis not present

## 2023-05-18 DIAGNOSIS — R531 Weakness: Secondary | ICD-10-CM | POA: Diagnosis not present

## 2023-05-18 DIAGNOSIS — M25621 Stiffness of right elbow, not elsewhere classified: Secondary | ICD-10-CM | POA: Diagnosis not present

## 2023-05-18 DIAGNOSIS — G8929 Other chronic pain: Secondary | ICD-10-CM | POA: Diagnosis not present

## 2023-05-18 DIAGNOSIS — M25521 Pain in right elbow: Secondary | ICD-10-CM | POA: Diagnosis not present

## 2023-05-18 DIAGNOSIS — M25531 Pain in right wrist: Secondary | ICD-10-CM | POA: Diagnosis not present

## 2023-05-24 DIAGNOSIS — M25621 Stiffness of right elbow, not elsewhere classified: Secondary | ICD-10-CM | POA: Diagnosis not present

## 2023-05-24 DIAGNOSIS — R531 Weakness: Secondary | ICD-10-CM | POA: Diagnosis not present

## 2023-05-24 DIAGNOSIS — M25631 Stiffness of right wrist, not elsewhere classified: Secondary | ICD-10-CM | POA: Diagnosis not present

## 2023-05-24 DIAGNOSIS — M25521 Pain in right elbow: Secondary | ICD-10-CM | POA: Diagnosis not present

## 2023-05-28 DIAGNOSIS — M25621 Stiffness of right elbow, not elsewhere classified: Secondary | ICD-10-CM | POA: Diagnosis not present

## 2023-05-28 DIAGNOSIS — M25631 Stiffness of right wrist, not elsewhere classified: Secondary | ICD-10-CM | POA: Diagnosis not present

## 2023-05-28 DIAGNOSIS — M25521 Pain in right elbow: Secondary | ICD-10-CM | POA: Diagnosis not present

## 2023-05-28 DIAGNOSIS — R531 Weakness: Secondary | ICD-10-CM | POA: Diagnosis not present

## 2023-06-02 DIAGNOSIS — M25631 Stiffness of right wrist, not elsewhere classified: Secondary | ICD-10-CM | POA: Diagnosis not present

## 2023-06-02 DIAGNOSIS — R531 Weakness: Secondary | ICD-10-CM | POA: Diagnosis not present

## 2023-06-02 DIAGNOSIS — M25621 Stiffness of right elbow, not elsewhere classified: Secondary | ICD-10-CM | POA: Diagnosis not present

## 2023-06-02 DIAGNOSIS — M25521 Pain in right elbow: Secondary | ICD-10-CM | POA: Diagnosis not present

## 2023-06-04 DIAGNOSIS — R531 Weakness: Secondary | ICD-10-CM | POA: Diagnosis not present

## 2023-06-04 DIAGNOSIS — M25631 Stiffness of right wrist, not elsewhere classified: Secondary | ICD-10-CM | POA: Diagnosis not present

## 2023-06-04 DIAGNOSIS — M25521 Pain in right elbow: Secondary | ICD-10-CM | POA: Diagnosis not present

## 2023-06-04 DIAGNOSIS — M25621 Stiffness of right elbow, not elsewhere classified: Secondary | ICD-10-CM | POA: Diagnosis not present

## 2023-06-11 DIAGNOSIS — M25621 Stiffness of right elbow, not elsewhere classified: Secondary | ICD-10-CM | POA: Diagnosis not present

## 2023-06-11 DIAGNOSIS — R531 Weakness: Secondary | ICD-10-CM | POA: Diagnosis not present

## 2023-06-11 DIAGNOSIS — M25521 Pain in right elbow: Secondary | ICD-10-CM | POA: Diagnosis not present

## 2023-06-11 DIAGNOSIS — M25631 Stiffness of right wrist, not elsewhere classified: Secondary | ICD-10-CM | POA: Diagnosis not present

## 2023-06-15 DIAGNOSIS — M25521 Pain in right elbow: Secondary | ICD-10-CM | POA: Diagnosis not present

## 2023-06-15 DIAGNOSIS — R531 Weakness: Secondary | ICD-10-CM | POA: Diagnosis not present

## 2023-06-15 DIAGNOSIS — M25621 Stiffness of right elbow, not elsewhere classified: Secondary | ICD-10-CM | POA: Diagnosis not present

## 2023-06-15 DIAGNOSIS — M25631 Stiffness of right wrist, not elsewhere classified: Secondary | ICD-10-CM | POA: Diagnosis not present

## 2023-06-21 DIAGNOSIS — M25621 Stiffness of right elbow, not elsewhere classified: Secondary | ICD-10-CM | POA: Diagnosis not present

## 2023-06-21 DIAGNOSIS — M25521 Pain in right elbow: Secondary | ICD-10-CM | POA: Diagnosis not present

## 2023-06-21 DIAGNOSIS — M25631 Stiffness of right wrist, not elsewhere classified: Secondary | ICD-10-CM | POA: Diagnosis not present

## 2023-06-21 DIAGNOSIS — R531 Weakness: Secondary | ICD-10-CM | POA: Diagnosis not present

## 2023-06-22 DIAGNOSIS — G8929 Other chronic pain: Secondary | ICD-10-CM | POA: Diagnosis not present

## 2023-06-22 DIAGNOSIS — S52531A Colles' fracture of right radius, initial encounter for closed fracture: Secondary | ICD-10-CM | POA: Diagnosis not present

## 2023-06-22 DIAGNOSIS — S52611A Displaced fracture of right ulna styloid process, initial encounter for closed fracture: Secondary | ICD-10-CM | POA: Diagnosis not present

## 2023-06-22 DIAGNOSIS — M25531 Pain in right wrist: Secondary | ICD-10-CM | POA: Diagnosis not present

## 2023-06-30 DIAGNOSIS — M25621 Stiffness of right elbow, not elsewhere classified: Secondary | ICD-10-CM | POA: Diagnosis not present

## 2023-06-30 DIAGNOSIS — M25521 Pain in right elbow: Secondary | ICD-10-CM | POA: Diagnosis not present

## 2023-06-30 DIAGNOSIS — R531 Weakness: Secondary | ICD-10-CM | POA: Diagnosis not present

## 2023-06-30 DIAGNOSIS — M25631 Stiffness of right wrist, not elsewhere classified: Secondary | ICD-10-CM | POA: Diagnosis not present

## 2023-07-06 DIAGNOSIS — M25621 Stiffness of right elbow, not elsewhere classified: Secondary | ICD-10-CM | POA: Diagnosis not present

## 2023-07-09 DIAGNOSIS — M25631 Stiffness of right wrist, not elsewhere classified: Secondary | ICD-10-CM | POA: Diagnosis not present

## 2023-07-09 DIAGNOSIS — R531 Weakness: Secondary | ICD-10-CM | POA: Diagnosis not present

## 2023-07-09 DIAGNOSIS — M25621 Stiffness of right elbow, not elsewhere classified: Secondary | ICD-10-CM | POA: Diagnosis not present

## 2023-07-09 DIAGNOSIS — M25521 Pain in right elbow: Secondary | ICD-10-CM | POA: Diagnosis not present

## 2023-07-13 DIAGNOSIS — M25621 Stiffness of right elbow, not elsewhere classified: Secondary | ICD-10-CM | POA: Diagnosis not present

## 2023-07-15 DIAGNOSIS — M25621 Stiffness of right elbow, not elsewhere classified: Secondary | ICD-10-CM | POA: Diagnosis not present

## 2023-07-21 DIAGNOSIS — R531 Weakness: Secondary | ICD-10-CM | POA: Diagnosis not present

## 2023-07-21 DIAGNOSIS — M25631 Stiffness of right wrist, not elsewhere classified: Secondary | ICD-10-CM | POA: Diagnosis not present

## 2023-07-21 DIAGNOSIS — M25521 Pain in right elbow: Secondary | ICD-10-CM | POA: Diagnosis not present

## 2023-07-21 DIAGNOSIS — M25621 Stiffness of right elbow, not elsewhere classified: Secondary | ICD-10-CM | POA: Diagnosis not present

## 2023-07-23 DIAGNOSIS — M25621 Stiffness of right elbow, not elsewhere classified: Secondary | ICD-10-CM | POA: Diagnosis not present

## 2023-07-27 DIAGNOSIS — M25521 Pain in right elbow: Secondary | ICD-10-CM | POA: Diagnosis not present

## 2023-07-27 DIAGNOSIS — M25631 Stiffness of right wrist, not elsewhere classified: Secondary | ICD-10-CM | POA: Diagnosis not present

## 2023-07-27 DIAGNOSIS — M25621 Stiffness of right elbow, not elsewhere classified: Secondary | ICD-10-CM | POA: Diagnosis not present

## 2023-07-27 DIAGNOSIS — R531 Weakness: Secondary | ICD-10-CM | POA: Diagnosis not present

## 2023-07-28 DIAGNOSIS — H4720 Unspecified optic atrophy: Secondary | ICD-10-CM | POA: Diagnosis not present

## 2023-07-28 DIAGNOSIS — H40051 Ocular hypertension, right eye: Secondary | ICD-10-CM | POA: Diagnosis not present

## 2023-07-28 DIAGNOSIS — H2513 Age-related nuclear cataract, bilateral: Secondary | ICD-10-CM | POA: Diagnosis not present

## 2023-07-30 DIAGNOSIS — M25621 Stiffness of right elbow, not elsewhere classified: Secondary | ICD-10-CM | POA: Diagnosis not present

## 2023-08-03 DIAGNOSIS — M25621 Stiffness of right elbow, not elsewhere classified: Secondary | ICD-10-CM | POA: Diagnosis not present

## 2023-08-12 DIAGNOSIS — S52531D Colles' fracture of right radius, subsequent encounter for closed fracture with routine healing: Secondary | ICD-10-CM | POA: Diagnosis not present

## 2023-08-12 DIAGNOSIS — S52611D Displaced fracture of right ulna styloid process, subsequent encounter for closed fracture with routine healing: Secondary | ICD-10-CM | POA: Diagnosis not present

## 2023-08-12 DIAGNOSIS — M24321 Pathological dislocation of right elbow, not elsewhere classified: Secondary | ICD-10-CM | POA: Diagnosis not present

## 2023-08-18 DIAGNOSIS — R531 Weakness: Secondary | ICD-10-CM | POA: Diagnosis not present

## 2023-08-18 DIAGNOSIS — M25521 Pain in right elbow: Secondary | ICD-10-CM | POA: Diagnosis not present

## 2023-08-18 DIAGNOSIS — M25621 Stiffness of right elbow, not elsewhere classified: Secondary | ICD-10-CM | POA: Diagnosis not present

## 2023-08-18 DIAGNOSIS — M25631 Stiffness of right wrist, not elsewhere classified: Secondary | ICD-10-CM | POA: Diagnosis not present

## 2023-08-20 DIAGNOSIS — M25621 Stiffness of right elbow, not elsewhere classified: Secondary | ICD-10-CM | POA: Diagnosis not present

## 2023-09-09 DIAGNOSIS — M25621 Stiffness of right elbow, not elsewhere classified: Secondary | ICD-10-CM | POA: Diagnosis not present

## 2024-01-27 DIAGNOSIS — H40013 Open angle with borderline findings, low risk, bilateral: Secondary | ICD-10-CM | POA: Diagnosis not present

## 2024-02-18 DIAGNOSIS — Z0000 Encounter for general adult medical examination without abnormal findings: Secondary | ICD-10-CM | POA: Diagnosis not present

## 2024-02-21 DIAGNOSIS — N182 Chronic kidney disease, stage 2 (mild): Secondary | ICD-10-CM | POA: Diagnosis not present

## 2024-02-21 DIAGNOSIS — R600 Localized edema: Secondary | ICD-10-CM | POA: Diagnosis not present

## 2024-02-21 DIAGNOSIS — I10 Essential (primary) hypertension: Secondary | ICD-10-CM | POA: Diagnosis not present

## 2024-02-21 DIAGNOSIS — R5383 Other fatigue: Secondary | ICD-10-CM | POA: Diagnosis not present

## 2024-02-21 DIAGNOSIS — E782 Mixed hyperlipidemia: Secondary | ICD-10-CM | POA: Diagnosis not present

## 2024-02-25 DIAGNOSIS — M810 Age-related osteoporosis without current pathological fracture: Secondary | ICD-10-CM | POA: Diagnosis not present

## 2024-02-25 DIAGNOSIS — R600 Localized edema: Secondary | ICD-10-CM | POA: Diagnosis not present

## 2024-02-25 DIAGNOSIS — I10 Essential (primary) hypertension: Secondary | ICD-10-CM | POA: Diagnosis not present

## 2024-02-25 DIAGNOSIS — E782 Mixed hyperlipidemia: Secondary | ICD-10-CM | POA: Diagnosis not present

## 2024-02-25 DIAGNOSIS — Z23 Encounter for immunization: Secondary | ICD-10-CM | POA: Diagnosis not present

## 2024-05-05 DIAGNOSIS — K5730 Diverticulosis of large intestine without perforation or abscess without bleeding: Secondary | ICD-10-CM | POA: Diagnosis not present

## 2024-05-05 DIAGNOSIS — D123 Benign neoplasm of transverse colon: Secondary | ICD-10-CM | POA: Diagnosis not present

## 2024-05-05 DIAGNOSIS — Z09 Encounter for follow-up examination after completed treatment for conditions other than malignant neoplasm: Secondary | ICD-10-CM | POA: Diagnosis not present

## 2024-05-05 DIAGNOSIS — Z980 Intestinal bypass and anastomosis status: Secondary | ICD-10-CM | POA: Diagnosis not present

## 2024-05-05 DIAGNOSIS — D124 Benign neoplasm of descending colon: Secondary | ICD-10-CM | POA: Diagnosis not present

## 2024-05-05 DIAGNOSIS — Z860101 Personal history of adenomatous and serrated colon polyps: Secondary | ICD-10-CM | POA: Diagnosis not present

## 2024-05-09 DIAGNOSIS — D123 Benign neoplasm of transverse colon: Secondary | ICD-10-CM | POA: Diagnosis not present

## 2024-05-09 DIAGNOSIS — D124 Benign neoplasm of descending colon: Secondary | ICD-10-CM | POA: Diagnosis not present
